# Patient Record
Sex: Female | Born: 1980 | Race: Black or African American | Hispanic: No | Marital: Single | Smoking: Former smoker
Health system: Southern US, Community
[De-identification: ages and names within clinical notes are randomized; demographics above are authoritative.]

## PROBLEM LIST (undated history)

## (undated) DIAGNOSIS — F329 Major depressive disorder, single episode, unspecified: Secondary | ICD-10-CM

## (undated) DIAGNOSIS — F32A Depression, unspecified: Secondary | ICD-10-CM

## (undated) DIAGNOSIS — J45909 Unspecified asthma, uncomplicated: Secondary | ICD-10-CM

## (undated) DIAGNOSIS — F1910 Other psychoactive substance abuse, uncomplicated: Secondary | ICD-10-CM

## (undated) DIAGNOSIS — F319 Bipolar disorder, unspecified: Secondary | ICD-10-CM

## (undated) DIAGNOSIS — F1010 Alcohol abuse, uncomplicated: Secondary | ICD-10-CM

## (undated) DIAGNOSIS — R569 Unspecified convulsions: Secondary | ICD-10-CM

---
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
## 2004-08-14 ENCOUNTER — Observation Stay: Payer: Self-pay | Admitting: Obstetrics and Gynecology

## 2004-09-22 ENCOUNTER — Observation Stay: Payer: Self-pay

## 2004-10-28 ENCOUNTER — Observation Stay: Payer: Self-pay

## 2004-11-21 ENCOUNTER — Observation Stay: Payer: Self-pay

## 2004-11-24 ENCOUNTER — Inpatient Hospital Stay: Payer: Self-pay | Admitting: Obstetrics and Gynecology

## 2006-02-17 ENCOUNTER — Emergency Department: Payer: Self-pay | Admitting: Emergency Medicine

## 2006-10-31 ENCOUNTER — Emergency Department: Payer: Self-pay | Admitting: Emergency Medicine

## 2008-07-06 ENCOUNTER — Emergency Department: Payer: Self-pay | Admitting: Emergency Medicine

## 2008-11-04 ENCOUNTER — Emergency Department: Payer: Self-pay | Admitting: Internal Medicine

## 2009-04-04 ENCOUNTER — Emergency Department: Payer: Self-pay | Admitting: Emergency Medicine

## 2009-05-30 ENCOUNTER — Emergency Department: Payer: Self-pay | Admitting: Emergency Medicine

## 2009-06-27 ENCOUNTER — Ambulatory Visit: Payer: Self-pay | Admitting: Surgery

## 2009-06-28 ENCOUNTER — Ambulatory Visit: Payer: Self-pay | Admitting: Surgery

## 2009-08-16 ENCOUNTER — Emergency Department: Payer: Self-pay | Admitting: Emergency Medicine

## 2009-10-28 ENCOUNTER — Emergency Department: Payer: Self-pay | Admitting: Emergency Medicine

## 2010-06-20 ENCOUNTER — Emergency Department: Payer: Self-pay | Admitting: Emergency Medicine

## 2010-10-21 ENCOUNTER — Emergency Department: Payer: Self-pay | Admitting: Unknown Physician Specialty

## 2010-10-31 ENCOUNTER — Emergency Department: Payer: Self-pay | Admitting: Emergency Medicine

## 2012-02-22 ENCOUNTER — Emergency Department: Payer: Self-pay | Admitting: Unknown Physician Specialty

## 2012-02-22 LAB — URINALYSIS, COMPLETE
Bacteria: NONE SEEN
Bilirubin,UR: NEGATIVE
Blood: NEGATIVE
Glucose,UR: NEGATIVE mg/dL (ref 0–75)
Leukocyte Esterase: NEGATIVE
Nitrite: NEGATIVE
Ph: 6 (ref 4.5–8.0)
Protein: 30
RBC,UR: 1 /HPF (ref 0–5)
Specific Gravity: 1.03 (ref 1.003–1.030)
Squamous Epithelial: 2
WBC UR: 2 /HPF (ref 0–5)

## 2012-02-22 LAB — CBC
HCT: 35 % (ref 35.0–47.0)
HGB: 11.2 g/dL — ABNORMAL LOW (ref 12.0–16.0)
MCH: 24.9 pg — ABNORMAL LOW (ref 26.0–34.0)
MCHC: 32.1 g/dL (ref 32.0–36.0)
MCV: 77 fL — ABNORMAL LOW (ref 80–100)
Platelet: 207 10*3/uL (ref 150–440)
RBC: 4.52 10*6/uL (ref 3.80–5.20)
RDW: 14.3 % (ref 11.5–14.5)
WBC: 6.4 10*3/uL (ref 3.6–11.0)

## 2012-02-22 LAB — COMPREHENSIVE METABOLIC PANEL
Albumin: 4.2 g/dL (ref 3.4–5.0)
Alkaline Phosphatase: 56 U/L (ref 50–136)
Anion Gap: 9 (ref 7–16)
BUN: 15 mg/dL (ref 7–18)
Bilirubin,Total: 0.4 mg/dL (ref 0.2–1.0)
Calcium, Total: 8.9 mg/dL (ref 8.5–10.1)
Chloride: 105 mmol/L (ref 98–107)
Co2: 25 mmol/L (ref 21–32)
Creatinine: 0.76 mg/dL (ref 0.60–1.30)
EGFR (African American): >60
EGFR (Non-African Amer.): >60
Glucose: 88 mg/dL (ref 65–99)
Osmolality: 278 (ref 275–301)
Potassium: 3.6 mmol/L (ref 3.5–5.1)
SGOT(AST): 20 U/L (ref 15–37)
SGPT (ALT): 16 U/L
Sodium: 139 mmol/L (ref 136–145)
Total Protein: 7.6 g/dL (ref 6.4–8.2)

## 2012-02-22 LAB — PREGNANCY, URINE: Pregnancy Test, Urine: NEGATIVE m[IU]/mL

## 2013-03-02 ENCOUNTER — Emergency Department: Payer: Self-pay | Admitting: Emergency Medicine

## 2013-03-02 LAB — CBC WITH DIFFERENTIAL/PLATELET
Basophil #: 0 10*3/uL (ref 0.0–0.1)
Basophil %: 0.2 %
Eosinophil #: 0.1 10*3/uL (ref 0.0–0.7)
Eosinophil %: 1.4 %
HCT: 37.2 % (ref 35.0–47.0)
HGB: 12.1 g/dL (ref 12.0–16.0)
Lymphocyte #: 1.9 10*3/uL (ref 1.0–3.6)
Lymphocyte %: 21.1 %
MCH: 24.5 pg — ABNORMAL LOW (ref 26.0–34.0)
MCHC: 32.5 g/dL (ref 32.0–36.0)
MCV: 75 fL — ABNORMAL LOW (ref 80–100)
Monocyte #: 0.6 x10 3/mm (ref 0.2–0.9)
Monocyte %: 7.1 %
Neutrophil #: 6.3 10*3/uL (ref 1.4–6.5)
Neutrophil %: 70.2 %
Platelet: 207 10*3/uL (ref 150–440)
RBC: 4.93 10*6/uL (ref 3.80–5.20)
RDW: 15.2 % — ABNORMAL HIGH (ref 11.5–14.5)
WBC: 9 10*3/uL (ref 3.6–11.0)

## 2013-03-02 LAB — URINALYSIS, COMPLETE
Bilirubin,UR: NEGATIVE
Blood: NEGATIVE
Glucose,UR: NEGATIVE mg/dL (ref 0–75)
Ketone: NEGATIVE
Nitrite: NEGATIVE
Ph: 5 (ref 4.5–8.0)
Protein: NEGATIVE
RBC,UR: 7 /HPF (ref 0–5)
Specific Gravity: 1.023 (ref 1.003–1.030)
Squamous Epithelial: 25
WBC UR: 3 /HPF (ref 0–5)

## 2013-03-02 LAB — COMPREHENSIVE METABOLIC PANEL
Albumin: 3.9 g/dL (ref 3.4–5.0)
Alkaline Phosphatase: 47 U/L — ABNORMAL LOW (ref 50–136)
Anion Gap: 5 — ABNORMAL LOW (ref 7–16)
BUN: 13 mg/dL (ref 7–18)
Bilirubin,Total: 0.5 mg/dL (ref 0.2–1.0)
Calcium, Total: 8.8 mg/dL (ref 8.5–10.1)
Chloride: 107 mmol/L (ref 98–107)
Co2: 24 mmol/L (ref 21–32)
Creatinine: 0.92 mg/dL (ref 0.60–1.30)
EGFR (African American): >60
EGFR (Non-African Amer.): >60
Glucose: 90 mg/dL (ref 65–99)
Osmolality: 272 (ref 275–301)
Potassium: 4.1 mmol/L (ref 3.5–5.1)
SGOT(AST): 18 U/L (ref 15–37)
SGPT (ALT): 12 U/L (ref 12–78)
Sodium: 136 mmol/L (ref 136–145)
Total Protein: 7.3 g/dL (ref 6.4–8.2)

## 2013-03-02 LAB — GC/CHLAMYDIA PROBE AMP

## 2013-03-02 LAB — PREGNANCY, URINE: Pregnancy Test, Urine: NEGATIVE m[IU]/mL

## 2013-03-02 LAB — WET PREP, GENITAL

## 2013-03-04 LAB — URINE CULTURE

## 2013-10-16 ENCOUNTER — Emergency Department: Payer: Self-pay | Admitting: Emergency Medicine

## 2013-10-16 LAB — URINALYSIS, COMPLETE
Bilirubin,UR: NEGATIVE
Blood: NEGATIVE
Glucose,UR: NEGATIVE mg/dL (ref 0–75)
Nitrite: NEGATIVE
Ph: 5 (ref 4.5–8.0)
Protein: 30
RBC,UR: 1 /HPF (ref 0–5)
Specific Gravity: 1.027 (ref 1.003–1.030)
Squamous Epithelial: 2
WBC UR: 2 /HPF (ref 0–5)

## 2013-10-16 LAB — COMPREHENSIVE METABOLIC PANEL
Albumin: 3.9 g/dL (ref 3.4–5.0)
Alkaline Phosphatase: 45 U/L
Anion Gap: 8 (ref 7–16)
BUN: 10 mg/dL (ref 7–18)
Bilirubin,Total: 0.2 mg/dL (ref 0.2–1.0)
Calcium, Total: 9.1 mg/dL (ref 8.5–10.1)
Chloride: 104 mmol/L (ref 98–107)
Co2: 23 mmol/L (ref 21–32)
Creatinine: 0.62 mg/dL (ref 0.60–1.30)
EGFR (African American): >60
EGFR (Non-African Amer.): >60
Glucose: 80 mg/dL (ref 65–99)
Osmolality: 268 (ref 275–301)
Potassium: 3.5 mmol/L (ref 3.5–5.1)
SGOT(AST): 12 U/L — ABNORMAL LOW (ref 15–37)
SGPT (ALT): 16 U/L (ref 12–78)
Sodium: 135 mmol/L — ABNORMAL LOW (ref 136–145)
Total Protein: 7.8 g/dL (ref 6.4–8.2)

## 2013-10-16 LAB — CBC
HCT: 35 % (ref 35.0–47.0)
HGB: 11.3 g/dL — ABNORMAL LOW (ref 12.0–16.0)
MCH: 24.3 pg — ABNORMAL LOW (ref 26.0–34.0)
MCHC: 32.2 g/dL (ref 32.0–36.0)
MCV: 76 fL — ABNORMAL LOW (ref 80–100)
Platelet: 226 10*3/uL (ref 150–440)
RBC: 4.64 10*6/uL (ref 3.80–5.20)
RDW: 14.2 % (ref 11.5–14.5)
WBC: 10.5 10*3/uL (ref 3.6–11.0)

## 2013-10-16 LAB — PREGNANCY, URINE: Pregnancy Test, Urine: POSITIVE m[IU]/mL

## 2013-10-24 DIAGNOSIS — O039 Complete or unspecified spontaneous abortion without complication: Secondary | ICD-10-CM | POA: Insufficient documentation

## 2013-10-24 DIAGNOSIS — IMO0001 Reserved for inherently not codable concepts without codable children: Secondary | ICD-10-CM | POA: Insufficient documentation

## 2014-11-21 DIAGNOSIS — R51 Headache: Secondary | ICD-10-CM

## 2014-11-21 DIAGNOSIS — G479 Sleep disorder, unspecified: Secondary | ICD-10-CM | POA: Insufficient documentation

## 2014-11-21 DIAGNOSIS — R569 Unspecified convulsions: Secondary | ICD-10-CM | POA: Insufficient documentation

## 2014-11-21 DIAGNOSIS — R519 Headache, unspecified: Secondary | ICD-10-CM | POA: Insufficient documentation

## 2014-11-21 DIAGNOSIS — F951 Chronic motor or vocal tic disorder: Secondary | ICD-10-CM | POA: Insufficient documentation

## 2015-08-03 ENCOUNTER — Emergency Department
Admission: EM | Admit: 2015-08-03 | Discharge: 2015-08-03 | Disposition: A | Discharge status: Home / Self Care | Payer: Medicaid Other | Attending: Emergency Medicine | Admitting: Emergency Medicine

## 2015-08-03 ENCOUNTER — Encounter: Payer: Self-pay | Admitting: Emergency Medicine

## 2015-08-03 DIAGNOSIS — J069 Acute upper respiratory infection, unspecified: Secondary | ICD-10-CM

## 2015-08-03 DIAGNOSIS — Z87891 Personal history of nicotine dependence: Secondary | ICD-10-CM | POA: Diagnosis not present

## 2015-08-03 DIAGNOSIS — R05 Cough: Secondary | ICD-10-CM | POA: Diagnosis present

## 2015-08-03 HISTORY — DX: Unspecified convulsions: R56.9

## 2015-08-03 MED ORDER — AMOXICILLIN 500 MG PO TABS: 500.0000 mg | ORAL_TABLET | Freq: Two times a day (BID) | ORAL | Status: DC

## 2015-08-03 MED ORDER — GUAIFENESIN-CODEINE 100-10 MG/5ML PO SOLN: 10.0000 mL | ORAL | Status: DC | PRN

## 2015-08-03 NOTE — ED Provider Notes (Signed)
[facility] Emergency Department Provider Note  ____________________________________________  Time seen: Approximately 7:26 AM  I have reviewed the triage vital signs and the nursing notes.   HISTORY  Chief Complaint Cough    HPI BROOKS COCKRILL is a 34 y.o. female who presents for evaluation of cough congestion for the last 3-4 days. Patient states that she was seen in urgent care and given medicine for allergies but has progressively continued to get worse with her sore throat and cough. States fever on and off.   Past Medical History  Diagnosis Date  . Seizures ([place])     There are no active problems to display for this patient.   History reviewed. No pertinent past surgical history.  Current Outpatient Rx  Name  Route  Sig  Dispense  Refill  . amoxicillin (AMOXIL) 500 MG tablet   Oral   Take 1 tablet (500 mg total) by mouth 2 (two) times daily.   20 tablet   0   . guaiFENesin-codeine 100-10 MG/5ML syrup   Oral   Take 10 mLs by mouth every 4 (four) hours as needed for cough.   180 mL   0     Allergies Review of patient's allergies indicates no known allergies.  No family history on file.  Social History Social History  Substance Use Topics  . Smoking status: Former Research scientist (life sciences)  . Smokeless tobacco: None  . Alcohol Use: No    Review of Systems Constitutional: Occasional fever/chills Eyes: No visual changes. ENT: Positive sore throat positive runny nose congestion and drainage. Cardiovascular: Denies chest pain. Respiratory: Denies shortness of breath. Positive for cough. Gastrointestinal: No abdominal pain.  No nausea, no vomiting.  No diarrhea.  No constipation. Genitourinary: Negative for dysuria. Musculoskeletal: Negative for back pain. Skin: Negative for rash. Neurological: Negative for headaches, focal weakness or numbness.  10-point ROS otherwise negative.  ____________________________________________  BP 122/76 mmHg   Pulse 87  Temp(Src) 98.6 F (37 C) (Oral)  Resp 20  Ht 5' (1.524 m)  Wt 135 lb (61.236 kg)  BMI 26.37 kg/m2  SpO2 96%  PHYSICAL EXAM:  VITAL SIGNS: ED Triage Vitals  Enc Vitals Group     BP --      Pulse --      Resp --      Temp --      Temp src --      SpO2 --      Weight --      Height --      Head Cir --      Peak Flow --      Pain Score --      Pain Loc --      Pain Edu? --      Excl. in [place]? --     Constitutional: Alert and oriented. Well appearing and in no acute distress. Eyes: Conjunctivae are normal. PERRL. EOMI. Head: Atraumatic. Nose: No congestion/rhinnorhea. Mouth/Throat: Mucous membranes are moist.  Oropharynx non-erythematous. Neck: No stridor.   Cardiovascular: Normal rate, regular rhythm. Grossly normal heart sounds.  Good peripheral circulation. Respiratory: Normal respiratory effort.  No retractions. Lungs CTAB. Gastrointestinal: Soft and nontender. No distention. No abdominal bruits. No CVA tenderness. Musculoskeletal: No lower extremity tenderness nor edema.  No joint effusions. Neurologic:  Normal speech and language. No gross focal neurologic deficits are appreciated. No gait instability. Skin:  Skin is warm, dry and intact. No rash noted. Psychiatric: Mood and affect are normal. Speech and behavior are  normal.  ____________________________________________   LABS (all labs ordered are listed, but only abnormal results are displayed)  Labs Reviewed - No data to display ____________________________________________    PROCEDURES  Procedure(s) performed: None  Critical Care performed: No  ____________________________________________   INITIAL IMPRESSION / ASSESSMENT AND PLAN / ED COURSE  Pertinent labs & imaging results that were available during my care of the patient were reviewed by me and considered in my medical decision making (see chart for details).  Respiratory infection Rx given for Z-Pak, Robitussin-AC. Patient to  follow up with PCP or return to the ER as needed. Patient voices no other emergency medical complaints at this time. ____________________________________________   FINAL CLINICAL IMPRESSION(S) / ED DIAGNOSES  Final diagnoses:  URI, acute      Arlyss Repress, PA-C 08/03/15 0755  Orbie Pyo, MD 08/03/15 [phone]

## 2015-08-03 NOTE — Discharge Instructions (Signed)
Upper Respiratory Infection, Adult Most upper respiratory infections (URIs) are a viral infection of the air passages leading to the lungs. A URI affects the nose, throat, and upper air passages. The most common type of URI is nasopharyngitis and is typically referred to as "the common cold." URIs run their course and usually go away on their own. Most of the time, a URI does not require medical attention, but sometimes a bacterial infection in the upper airways can follow a viral infection. This is called a secondary infection. Sinus and middle ear infections are common types of secondary upper respiratory infections. Bacterial pneumonia can also complicate a URI. A URI can worsen asthma and chronic obstructive pulmonary disease (COPD). Sometimes, these complications can require emergency medical care and may be life threatening.  CAUSES Almost all URIs are caused by viruses. A virus is a type of germ and can spread from one person to another.  RISKS FACTORS You may be at risk for a URI if:   You smoke.   You have chronic heart or lung disease.  You have a weakened defense (immune) system.   You are very young or very old.   You have nasal allergies or asthma.  You work in crowded or poorly ventilated areas.  You work in health care facilities or schools. SIGNS AND SYMPTOMS  Symptoms typically develop 2-3 days after you come in contact with a cold virus. Most viral URIs last 7-10 days. However, viral URIs from the influenza virus (flu virus) can last 14-18 days and are typically more severe. Symptoms may include:   Runny or stuffy (congested) nose.   Sneezing.   Cough.   Sore throat.   Headache.   Fatigue.   Fever.   Loss of appetite.   Pain in your forehead, behind your eyes, and over your cheekbones (sinus pain).  Muscle aches.  DIAGNOSIS  Your health care provider may diagnose a URI by:  Physical exam.  Tests to check that your symptoms are not due to  another condition such as:  Strep throat.  Sinusitis.  Pneumonia.  Asthma. TREATMENT  A URI goes away on its own with time. It cannot be cured with medicines, but medicines may be prescribed or recommended to relieve symptoms. Medicines may help:  Reduce your fever.  Reduce your cough.  Relieve nasal congestion. HOME CARE INSTRUCTIONS   Take medicines only as directed by your health care provider.   Gargle warm saltwater or take cough drops to comfort your throat as directed by your health care provider.  Use a warm mist humidifier or inhale steam from a shower to increase air moisture. This may make it easier to breathe.  Drink enough fluid to keep your urine clear or pale yellow.   Eat soups and other clear broths and maintain good nutrition.   Rest as needed.   Return to work when your temperature has returned to normal or as your health care provider advises. You may need to stay home longer to avoid infecting others. You can also use a face mask and careful hand washing to prevent spread of the virus.  Increase the usage of your inhaler if you have asthma.   Do not use any tobacco products, including cigarettes, chewing tobacco, or electronic cigarettes. If you need help quitting, ask your health care provider. PREVENTION  The best way to protect yourself from getting a cold is to practice good hygiene.   Avoid oral or hand contact with people with cold  symptoms.   °· Wash your hands often if contact occurs.   °There is no clear evidence that vitamin C, vitamin E, echinacea, or exercise reduces the chance of developing a cold. However, it is always recommended to get plenty of rest, exercise, and practice good nutrition.  °SEEK MEDICAL CARE IF:  °· You are getting worse rather than better.   °· Your symptoms are not controlled by medicine.   °· You have chills. °· You have worsening shortness of breath. °· You have brown or red mucus. °· You have yellow or brown nasal  discharge. °· You have pain in your face, especially when you bend forward. °· You have a fever. °· You have swollen neck glands. °· You have pain while swallowing. °· You have white areas in the back of your throat. °SEEK IMMEDIATE MEDICAL CARE IF:  °· You have severe or persistent: °¨ Headache. °¨ Ear pain. °¨ Sinus pain. °¨ Chest pain. °· You have chronic lung disease and any of the following: °¨ Wheezing. °¨ Prolonged cough. °¨ Coughing up blood. °¨ A change in your usual mucus. °· You have a stiff neck. °· You have changes in your: °¨ Vision. °¨ Hearing. °¨ Thinking. °¨ Mood. °MAKE SURE YOU:  °· Understand these instructions. °· Will watch your condition. °· Will get help right away if you are not doing well or get worse. °  °This information is not intended to replace advice given to you by your health care provider. Make sure you discuss any questions you have with your health care provider. °  °Document Released: 02/24/2001 Document Revised: 01/15/2015 Document Reviewed: 12/06/2013 °Elsevier Interactive Patient Education ©2016 [employer] ° °Cough, Adult °Coughing is a reflex that clears your throat and your airways. Coughing helps to heal and protect your lungs. It is normal to cough occasionally, but a cough that happens with other symptoms or lasts a long time may be a sign of a condition that needs treatment. A cough may last only 2-3 weeks (acute), or it may last longer than 8 weeks (chronic). °CAUSES °Coughing is commonly caused by: °· Breathing in substances that irritate your lungs. °· A viral or bacterial respiratory infection. °· Allergies. °· Asthma. °· Postnasal drip. °· Smoking. °· Acid backing up from the stomach into the esophagus (gastroesophageal reflux). °· Certain medicines. °· Chronic lung problems, including COPD (or rarely, lung cancer). °· Other medical conditions such as heart failure. °HOME CARE INSTRUCTIONS  °Pay attention to any changes in your symptoms. Take these actions to  help with your discomfort: °· Take medicines only as told by your health care provider. °¨ If you were prescribed an antibiotic medicine, take it as told by your health care provider. Do not stop taking the antibiotic even if you start to feel better. °¨ Talk with your health care provider before you take a cough suppressant medicine. °· Drink enough fluid to keep your urine clear or pale yellow. °· If the air is dry, use a cold steam vaporizer or humidifier in your bedroom or your home to help loosen secretions. °· Avoid anything that causes you to cough at work or at home. °· If your cough is worse at night, try sleeping in a semi-upright position. °· Avoid cigarette smoke. If you smoke, quit smoking. If you need help quitting, ask your health care provider. °· Avoid caffeine. °· Avoid alcohol. °· Rest as needed. °SEEK MEDICAL CARE IF:  °· You have new symptoms. °· You cough up pus. °· Your cough   does not get better after 2-3 weeks, or your cough gets worse. °· You cannot control your cough with suppressant medicines and you are losing sleep. °· You develop pain that is getting worse or pain that is not controlled with pain medicines. °· You have a fever. °· You have unexplained weight loss. °· You have night sweats. °SEEK IMMEDIATE MEDICAL CARE IF: °· You cough up blood. °· You have difficulty breathing. °· Your heartbeat is very fast. °  °This information is not intended to replace advice given to you by your health care provider. Make sure you discuss any questions you have with your health care provider. °  °Document Released: 02/27/2011 Document Revised: 05/22/2015 Document Reviewed: 11/07/2014 °Elsevier Interactive Patient Education ©2016 Else[employer] Inc. ° °

## 2015-08-03 NOTE — ED Notes (Signed)
Cough x 1 week

## 2015-10-15 ENCOUNTER — Emergency Department: Payer: Worker's Compensation

## 2015-10-15 ENCOUNTER — Encounter: Payer: Self-pay | Admitting: Emergency Medicine

## 2015-10-15 ENCOUNTER — Emergency Department
Admission: EM | Admit: 2015-10-15 | Discharge: 2015-10-15 | Disposition: A | Discharge status: Home / Self Care | Payer: Worker's Compensation | Attending: Emergency Medicine | Admitting: Emergency Medicine

## 2015-10-15 DIAGNOSIS — F17210 Nicotine dependence, cigarettes, uncomplicated: Secondary | ICD-10-CM | POA: Diagnosis not present

## 2015-10-15 DIAGNOSIS — M25512 Pain in left shoulder: Secondary | ICD-10-CM | POA: Diagnosis not present

## 2015-10-15 DIAGNOSIS — W868XXA Exposure to other electric current, initial encounter: Secondary | ICD-10-CM | POA: Diagnosis not present

## 2015-10-15 DIAGNOSIS — Y990 Civilian activity done for income or pay: Secondary | ICD-10-CM | POA: Diagnosis not present

## 2015-10-15 DIAGNOSIS — R0602 Shortness of breath: Secondary | ICD-10-CM | POA: Insufficient documentation

## 2015-10-15 DIAGNOSIS — T754XXA Electrocution, initial encounter: Secondary | ICD-10-CM | POA: Insufficient documentation

## 2015-10-15 DIAGNOSIS — R079 Chest pain, unspecified: Secondary | ICD-10-CM | POA: Diagnosis present

## 2015-10-15 DIAGNOSIS — Y9389 Activity, other specified: Secondary | ICD-10-CM | POA: Insufficient documentation

## 2015-10-15 DIAGNOSIS — R51 Headache: Secondary | ICD-10-CM | POA: Insufficient documentation

## 2015-10-15 DIAGNOSIS — Y9289 Other specified places as the place of occurrence of the external cause: Secondary | ICD-10-CM | POA: Insufficient documentation

## 2015-10-15 LAB — BASIC METABOLIC PANEL
Anion gap: 5 (ref 5–15)
BUN: 12 mg/dL (ref 6–20)
CO2: 23 mmol/L (ref 22–32)
Calcium: 9.3 mg/dL (ref 8.9–10.3)
Chloride: 108 mmol/L (ref 101–111)
Creatinine, Ser: 0.86 mg/dL (ref 0.44–1.00)
GFR calc Af Amer: >60 mL/min (ref >60)
GFR calc non Af Amer: >60 mL/min (ref >60)
Glucose, Bld: 123 mg/dL — ABNORMAL HIGH (ref 65–99)
Potassium: 3.6 mmol/L (ref 3.5–5.1)
Sodium: 136 mmol/L (ref 135–145)

## 2015-10-15 LAB — CBC
HCT: 35.4 % (ref 35.0–47.0)
Hemoglobin: 11.7 g/dL — ABNORMAL LOW (ref 12.0–16.0)
MCH: 24.6 pg — ABNORMAL LOW (ref 26.0–34.0)
MCHC: 33 g/dL (ref 32.0–36.0)
MCV: 74.6 fL — ABNORMAL LOW (ref 80.0–100.0)
Platelets: 211 10*3/uL (ref 150–440)
RBC: 4.75 MIL/uL (ref 3.80–5.20)
RDW: 14.6 % — ABNORMAL HIGH (ref 11.5–14.5)
WBC: 8.5 10*3/uL (ref 3.6–11.0)

## 2015-10-15 LAB — TROPONIN I
Troponin I: <0.03 ng/mL (ref <0.031)
Troponin I: <0.03 ng/mL (ref <0.031)

## 2015-10-15 LAB — CK: Total CK: 126 U/L (ref 38–234)

## 2015-10-15 MED ORDER — KETOROLAC TROMETHAMINE 30 MG/ML IJ SOLN
INTRAMUSCULAR | Status: AC
  Administered 2015-10-15: 30 mg
  Filled 2015-10-15: qty 1

## 2015-10-15 MED ORDER — DIAZEPAM 5 MG/ML IJ SOLN
5.0000 mg | Freq: Once | INTRAMUSCULAR | Status: AC
  Administered 2015-10-15: 5 mg via INTRAVENOUS
  Filled 2015-10-15: qty 2

## 2015-10-15 NOTE — ED Notes (Signed)
Patient presents to the ED after getting "shocked" by a machine at work.  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  Patient states around 2pm today she was shocked in the head.  Patient states she felt kind of dazed, she never fell or lost consciousness.  Patient now is having some chest pain, left shoulder pain and shortness of breath.  Patient states chest pain feels like a squeezing or tightening.

## 2015-10-15 NOTE — ED Notes (Signed)
Iv started and meds given.  Pt anxious.  Family with pt.  nsr on monitor.  Repeat ekg done.

## 2015-10-15 NOTE — ED Provider Notes (Signed)
[facility] Emergency Department Provider Note  ____________________________________________  Time seen: Approximately [id number] PM  I have reviewed the triage vital signs and the nursing notes.   HISTORY  Chief Complaint Chest Pain and Shortness of Breath    HPI Ashley Fry is a 35 y.o. female with a history of asthma who is presenting today with chest pain after being shocked by machine at work. She says that she works in [employer] and was touching a switch on a machine when she felt a hard shock that made her feel like she was going to pass out. She said that she felt the shock always from right arm to the back of her head. At this time she is having a 10 out of 10 squeezing pain that she describes mostly in her left trapezius. She denies any nausea or vomiting. Says that she does have some associated shortness of breath.   Past Medical History  Diagnosis Date  . Seizures ([place])     There are no active problems to display for this patient.   History reviewed. No pertinent past surgical history.  No current outpatient prescriptions on file.  Allergies Review of patient's allergies indicates no known allergies.  No family history on file.  Social History Social History  Substance Use Topics  . Smoking status: Current Every Day Smoker -- 0.10 packs/day    Types: Cigarettes  . Smokeless tobacco: None  . Alcohol Use: No    Review of Systems Constitutional: No fever/chills Eyes: No visual changes. ENT: No sore throat. Cardiovascular: As above Respiratory: As above Gastrointestinal: No abdominal pain.  No nausea, no vomiting.  No diarrhea.  No constipation. Genitourinary: Negative for dysuria. Musculoskeletal: Negative for back pain. Skin: Negative for rash. Neurological: Negative for headaches, focal weakness or numbness.  10-point ROS otherwise negative.  ____________________________________________   PHYSICAL EXAM:  VITAL  SIGNS: ED Triage Vitals  Enc Vitals Group     BP 10/15/15 1853 157/87 mmHg     Pulse Rate 10/15/15 1853 98     Resp 10/15/15 1853 20     Temp 10/15/15 1853 98.4 F (36.9 C)     Temp Source 10/15/15 1853 Oral     SpO2 10/15/15 1853 100 %     Weight 10/15/15 1853 138 lb (62.596 kg)     Height 10/15/15 1853 5' (1.524 m)     Head Cir --      Peak Flow --      Pain Score 10/15/15 1854 10     Pain Loc --      Pain Edu? --      Excl. in [place]? --     Constitutional: Alert and oriented. Well appearing and in no acute distress. Eyes: Conjunctivae are normal. PERRL. EOMI. Head: Atraumatic. Nose: No congestion/rhinnorhea. Mouth/Throat: Mucous membranes are moist.   Neck: No stridor.   Cardiovascular: Normal rate, regular rhythm. Grossly normal heart sounds.  Good peripheral circulation. Reproducible tenderness to the left trapezius as well as over the sternum. Respiratory: Normal respiratory effort.  No retractions. Lungs CTAB. Gastrointestinal: Soft and nontender. No distention. No abdominal bruits. No CVA tenderness. Musculoskeletal: No lower extremity tenderness nor edema.  No joint effusions. Neurologic:  Normal speech and language. No gross focal neurologic deficits are appreciated. Skin:  Skin is warm, dry and intact. No rash noted. Psychiatric: Mood and affect are normal. Speech and behavior are normal.  ____________________________________________   LABS (all labs ordered are listed, but only  abnormal results are displayed)  Labs Reviewed  BASIC METABOLIC PANEL - Abnormal; Notable for the following:    Glucose, Bld 123 (*)    All other components within normal limits  CBC - Abnormal; Notable for the following:    Hemoglobin 11.7 (*)    MCV 74.6 (*)    MCH 24.6 (*)    RDW 14.6 (*)    All other components within normal limits  TROPONIN I  TROPONIN I  CK   ____________________________________________  EKG  ED ECG REPORT I, Doran Stabler, the attending physician,  personally viewed and interpreted this ECG.   Date: 10/15/2015  EKG Time: 1855  Rate: 97  Rhythm: normal sinus rhythm  Axis: Normal axis  Intervals:none  ST&T Change: T wave inversions in V2 and V3 which are unchanged from previous. Possible lateral T-wave inversions but with poor baseline. We'll repeat EKG. No ST elevations or depressions.  ED ECG REPORT I, Doran Stabler, the attending physician, personally viewed and interpreted this ECG.   Date: 10/15/2015  EKG Time: 2220  Rate: 70  Rhythm: normal sinus rhythm  Axis: Normal axis  Intervals:none  ST&T Change: No ST segment elevation or depression. T-wave inversions in V1 through V3 which are unchanged from previous. Normal T waves in the lateral leads of V4 through V6.   ____________________________________________  RADIOLOGY  No acute finding on the chest x-ray. ____________________________________________   PROCEDURES   ____________________________________________   INITIAL IMPRESSION / ASSESSMENT AND PLAN / ED COURSE  Pertinent labs & imaging results that were available during my care of the patient were reviewed by me and considered in my medical decision making (see chart for details).  ----------------------------------------- 11:40 PM on 10/15/2015 -----------------------------------------  Patient says that the pain is now 7 out of 10 after Valium. Second EKG as well as labs are highly reassuring. There are no changes on the second EKG when compared to previous EKGs from past visits. The second troponin is undetectable as well as a normal CK. Patient still with tenderness to the anterior chest as well as the left trapezius. Likely muscle soreness. She says that she does have soreness sometimes from her job which does require some degree of forceful lifting.  No evidence of cardiac damage and the patient has been here for over 9 hours from the time of the initial injury without syncope or arrhythmia. Do not  suspect any pathologic cardiac injury at this time. We'll discharge to home. Explained the plan as well as the results to the patient as well as her father. They understand and are willing to comply. The patient says that she will use ibuprofen at home as well as muscle cream such as icy hot or Aspercreme for pain relief. She is a patient at the [facility] and will follow-up there for continuing care. ____________________________________________   FINAL CLINICAL IMPRESSION(S) / ED DIAGNOSES  Electrical shock. Chest pain.    Orbie Pyo, MD 10/15/15 [phone]

## 2015-10-15 NOTE — ED Notes (Signed)
Pt reports getting shocked at work today and having pain in the back head.  Happened at 1400 today.  Now pt has intermittent chest pain.  States pain goes across chest, neck and shoulders.  No sob.  cig smoker.  nsr on monitor.  Family with pt.

## 2015-12-22 ENCOUNTER — Encounter: Payer: Self-pay | Admitting: *Deleted

## 2015-12-22 ENCOUNTER — Emergency Department: Payer: Medicaid Other

## 2015-12-22 ENCOUNTER — Emergency Department
Admission: EM | Admit: 2015-12-22 | Discharge: 2015-12-22 | Disposition: A | Discharge status: Home / Self Care | Payer: Medicaid Other | Attending: Emergency Medicine | Admitting: Emergency Medicine

## 2015-12-22 DIAGNOSIS — F17210 Nicotine dependence, cigarettes, uncomplicated: Secondary | ICD-10-CM | POA: Diagnosis not present

## 2015-12-22 DIAGNOSIS — J029 Acute pharyngitis, unspecified: Secondary | ICD-10-CM | POA: Diagnosis not present

## 2015-12-22 DIAGNOSIS — R05 Cough: Secondary | ICD-10-CM | POA: Diagnosis present

## 2015-12-22 DIAGNOSIS — R112 Nausea with vomiting, unspecified: Secondary | ICD-10-CM

## 2015-12-22 LAB — POCT RAPID STREP A: Streptococcus, Group A Screen (Direct): NEGATIVE

## 2015-12-22 MED ORDER — PROMETHAZINE-DM 6.25-15 MG/5ML PO SYRP: 5.0000 mL | ORAL_SOLUTION | Freq: Four times a day (QID) | ORAL | Status: DC | PRN

## 2015-12-22 MED ORDER — MAGIC MOUTHWASH W/LIDOCAINE: 5.0000 mL | Freq: Four times a day (QID) | ORAL | Status: DC | PRN

## 2015-12-22 MED ORDER — AMOXICILLIN 875 MG PO TABS: 875.0000 mg | ORAL_TABLET | Freq: Two times a day (BID) | ORAL | Status: DC

## 2015-12-22 MED ORDER — ONDANSETRON 4 MG PO TBDP
4.0000 mg | ORAL_TABLET | Freq: Once | ORAL | Status: AC
  Administered 2015-12-22: 4 mg via ORAL
  Filled 2015-12-22: qty 1

## 2015-12-22 NOTE — Discharge Instructions (Signed)
Nausea and Vomiting Nausea is a sick feeling that often comes before throwing up (vomiting). Vomiting is a reflex where stomach contents come out of your mouth. Vomiting can cause severe loss of body fluids (dehydration). Children and elderly adults can become dehydrated quickly, especially if they also have diarrhea. Nausea and vomiting are symptoms of a condition or disease. It is important to find the cause of your symptoms. CAUSES   Direct irritation of the stomach lining. This irritation can result from increased acid production (gastroesophageal reflux disease), infection, food poisoning, taking certain medicines (such as nonsteroidal anti-inflammatory drugs), alcohol use, or tobacco use.  Signals from the brain.These signals could be caused by a headache, heat exposure, an inner ear disturbance, increased pressure in the brain from injury, infection, a tumor, or a concussion, pain, emotional stimulus, or metabolic problems.  An obstruction in the gastrointestinal tract (bowel obstruction).  Illnesses such as diabetes, hepatitis, gallbladder problems, appendicitis, kidney problems, cancer, sepsis, atypical symptoms of a heart attack, or eating disorders.  Medical treatments such as chemotherapy and radiation.  Receiving medicine that makes you sleep (general anesthetic) during surgery. DIAGNOSIS Your caregiver may ask for tests to be done if the problems do not improve after a few days. Tests may also be done if symptoms are severe or if the reason for the nausea and vomiting is not clear. Tests may include:  Urine tests.  Blood tests.  Stool tests.  Cultures (to look for evidence of infection).  X-rays or other imaging studies. Test results can help your caregiver make decisions about treatment or the need for additional tests. TREATMENT You need to stay well hydrated. Drink frequently but in small amounts.You may wish to drink water, sports drinks, clear broth, or eat frozen  ice pops or gelatin dessert to help stay hydrated.When you eat, eating slowly may help prevent nausea.There are also some antinausea medicines that may help prevent nausea. HOME CARE INSTRUCTIONS   Take all medicine as directed by your caregiver.  If you do not have an appetite, do not force yourself to eat. However, you must continue to drink fluids.  If you have an appetite, eat a normal diet unless your caregiver tells you differently.  Eat a variety of complex carbohydrates (rice, wheat, potatoes, bread), lean meats, yogurt, fruits, and vegetables.  Avoid high-fat foods because they are more difficult to digest.  Drink enough water and fluids to keep your urine clear or pale yellow.  If you are dehydrated, ask your caregiver for specific rehydration instructions. Signs of dehydration may include:  Severe thirst.  Dry lips and mouth.  Dizziness.  Dark urine.  Decreasing urine frequency and amount.  Confusion.  Rapid breathing or pulse. SEEK IMMEDIATE MEDICAL CARE IF:   You have blood or brown flecks (like coffee grounds) in your vomit.  You have black or bloody stools.  You have a severe headache or stiff neck.  You are confused.  You have severe abdominal pain.  You have chest pain or trouble breathing.  You do not urinate at least once every 8 hours.  You develop cold or clammy skin.  You continue to vomit for longer than 24 to 48 hours.  You have a fever. MAKE SURE YOU:   Understand these instructions.  Will watch your condition.  Will get help right away if you are not doing well or get worse.   This information is not intended to replace advice given to you by your health care provider. Make sure  you discuss any questions you have with your health care provider.   Document Released: 08/31/2005 Document Revised: 11/23/2011 Document Reviewed: 01/28/2011 Elsevier Interactive Patient Education 2016 Elsevier Inc[employer]  Pharyngitis Pharyngitis is  redness, pain, and swelling (inflammation) of your pharynx.  CAUSES  Pharyngitis is usually caused by infection. Most of the time, these infections are from viruses (viral) and are part of a cold. However, sometimes pharyngitis is caused by bacteria (bacterial). Pharyngitis can also be caused by allergies. Viral pharyngitis may be spread from person to person by coughing, sneezing, and personal items or utensils (cups, forks, spoons, toothbrushes). Bacterial pharyngitis may be spread from person to person by more intimate contact, such as kissing.  SIGNS AND SYMPTOMS  Symptoms of pharyngitis include:   Sore throat.   Tiredness (fatigue).   Low-grade fever.   Headache.  Joint pain and muscle aches.  Skin rashes.  Swollen lymph nodes.  Plaque-like film on throat or tonsils (often seen with bacterial pharyngitis). DIAGNOSIS  Your health care provider will ask you questions about your illness and your symptoms. Your medical history, along with a physical exam, is often all that is needed to diagnose pharyngitis. Sometimes, a rapid strep test is done. Other lab tests may also be done, depending on the suspected cause.  TREATMENT  Viral pharyngitis will usually get better in 3-4 days without the use of medicine. Bacterial pharyngitis is treated with medicines that kill germs (antibiotics).  HOME CARE INSTRUCTIONS   Drink enough water and fluids to keep your urine clear or pale yellow.   Only take over-the-counter or prescription medicines as directed by your health care provider:   If you are prescribed antibiotics, make sure you finish them even if you start to feel better.   Do not take aspirin.   Get lots of rest.   Gargle with 8 oz of salt water ( tsp of salt per 1 qt of water) as often as every 1-2 hours to soothe your throat.   Throat lozenges (if you are not at risk for choking) or sprays may be used to soothe your throat. SEEK MEDICAL CARE IF:   You have large,  tender lumps in your neck.  You have a rash.  You cough up green, yellow-brown, or bloody spit. SEEK IMMEDIATE MEDICAL CARE IF:   Your neck becomes stiff.  You drool or are unable to swallow liquids.  You vomit or are unable to keep medicines or liquids down.  You have severe pain that does not go away with the use of recommended medicines.  You have trouble breathing (not caused by a stuffy nose). MAKE SURE YOU:   Understand these instructions.  Will watch your condition.  Will get help right away if you are not doing well or get worse.   This information is not intended to replace advice given to you by your health care provider. Make sure you discuss any questions you have with your health care provider.   Document Released: 08/31/2005 Document Revised: 06/21/2013 Document Reviewed: 05/08/2013 Elsevier Interactive Patient Education [employer].

## 2015-12-22 NOTE — ED Provider Notes (Signed)
[facility] Emergency Department Provider Note  ____________________________________________  Time seen: Approximately 8:15 AM  I have reviewed the triage vital signs and the nursing notes.   HISTORY  Chief Complaint Sore Throat    HPI Ashley Fry is a 35 y.o. female , NAD, presents to the emergency department with 2 day history of sore throat, fever, cough. States she has been seen both in urgent care and her primary care provider in the last couple of days. Was diagnosed with pharyngitis by urgent care and given penicillin to take. Patient notes a penicillin seems to be making her nauseous and she can't keep the medication down. She was then seen the next day by her primary care provider who believes she had flu and started her on Tamiflu. Patient notes she hasn't felt much better since starting either the antibiotic nor the Tamiflu. Has had increasing sore throat, no change in fever, nausea and vomiting. Has noted no rashes. States her son had strep confirmed approximately one week ago. Notes she also has a cough that has caused left anterior lower rib pain. The pain only occurs when she coughs. Denies any chest pain, shortness of breath, wheezing, visual changes, numbness, weakness, tingling. Has had no diarrhea or abdominal pain.   Past Medical History  Diagnosis Date  . Seizures ([place])     There are no active problems to display for this patient.   History reviewed. No pertinent past surgical history.  Current Outpatient Rx  Name  Route  Sig  Dispense  Refill  . amoxicillin (AMOXIL) 875 MG tablet   Oral   Take 1 tablet (875 mg total) by mouth 2 (two) times daily.   20 tablet   0   . magic mouthwash w/lidocaine SOLN   Oral   Take 5 mLs by mouth 4 (four) times daily as needed for mouth pain.   240 mL   0     Please mix 30mL diphenhydramine, 73mL nystatin, 80 ...   . promethazine-dextromethorphan (PROMETHAZINE-DM) 6.25-15 MG/5ML syrup    Oral   Take 5 mLs by mouth 4 (four) times daily as needed for cough.   118 mL   0     Allergies Review of patient's allergies indicates no known allergies.  History reviewed. No pertinent family history.  Social History Social History  Substance Use Topics  . Smoking status: Current Every Day Smoker -- 0.10 packs/day    Types: Cigarettes  . Smokeless tobacco: None  . Alcohol Use: No     Review of Systems  Constitutional: Positive fever. No chills, rigors. Eyes: No visual changes. No discharge, redness, swelling ENT: Positive sore throat but no swelling. No nasal congestion, runny nose, headache, sinus pressure, ear pain Cardiovascular: No chest pain or palpitations. Respiratory: Positive cough. No shortness of breath. No wheezing.  Gastrointestinal: As of nausea, vomiting. No abdominal pain.   No diarrhea.   Musculoskeletal: Positive rib pain with cough. Negative for back and neck pain.  Skin: Negative for rash, bruising, skin sores. Neurological: Negative for headaches, focal weakness or numbness. No tingling. 10-point ROS otherwise negative.  ____________________________________________   PHYSICAL EXAM:  VITAL SIGNS: ED Triage Vitals  Enc Vitals Group     BP 12/22/15 0807 122/83 mmHg     Pulse Rate 12/22/15 0807 97     Resp 12/22/15 0807 18     Temp 12/22/15 0807 99.5 F (37.5 C)     Temp Source 12/22/15 0807 Oral  SpO2 12/22/15 0807 99 %     Weight 12/22/15 0807 140 lb (63.504 kg)     Height 12/22/15 0807 5' (1.524 m)     Head Cir --      Peak Flow --      Pain Score 12/22/15 0808 10     Pain Loc --      Pain Edu? --      Excl. in [place]? --     Constitutional: Alert and oriented. Well appearing and in no acute distress. Eyes: Conjunctivae are normal. PERRL. EOMI without pain.  Head: Atraumatic. ENT:      Ears: TMs visualized bilaterally with normal light reflex, no effusion, erythema, bulging, perforation.      Nose: No congestion/rhinnorhea.       Mouth/Throat: Mucous membranes are moist. Moderate erythema about the pharynx without swelling or exudate. Neck: No stridor. Supple with full range of motion Hematological/Lymphatic/Immunilogical: No cervical lymphadenopathy but patient tender about the anterior cervical chain. Cardiovascular: Normal rate, regular rhythm. Normal S1 and S2.  Good peripheral circulation. Respiratory: Normal respiratory effort without tachypnea or retractions. Lungs CTAB with breath sounds noted throughout all lung fields. Neurologic:  Normal speech and language. No gross focal neurologic deficits are appreciated.  Skin:  Skin is warm, dry and intact. No rash noted. Psychiatric: Mood and affect are normal. Speech and behavior are normal. Patient exhibits appropriate insight and judgement.   ____________________________________________   LABS (all labs ordered are listed, but only abnormal results are displayed)  Labs Reviewed  CULTURE, GROUP A STREP [facility])   ____________________________________________  EKG  None ____________________________________________  RADIOLOGY I have personally viewed and evaluated these images (plain radiographs) as part of my medical decision making, as well as reviewing the written report by the radiologist.  Dg Chest 2 View  12/22/2015  CLINICAL DATA:  Sore throat, cough and fever for 3 days. EXAM: CHEST  2 VIEW COMPARISON:  10/15/2015 FINDINGS: The heart size and mediastinal contours are within normal limits. Both lungs are clear. The visualized skeletal structures are unremarkable. IMPRESSION: No active cardiopulmonary disease. Electronically Signed   By: Markus Daft M.D.   On: 12/22/2015 08:55    ____________________________________________    PROCEDURES  Procedure(s) performed: None     Medications  ondansetron (ZOFRAN-ODT) disintegrating tablet 4 mg (4 mg Oral Given 12/22/15 0840)     ____________________________________________   INITIAL IMPRESSION /  ASSESSMENT AND PLAN / ED COURSE  Pertinent labs & imaging results that were available during my care of the patient were reviewed by me and considered in my medical decision making (see chart for details).  Patient's diagnosis is consistent with acute pharyngitis causing nausea, vomiting. Anticipate the patient has strep pharyngitis. Throat culture was sent to evaluate further.  Patient will be discharged home with prescriptions for amoxicillin, Magic mouthwash with lidocaine, promethazine DM syrup to take as directed. Patient may continue with Tylenol or ibuprofen as needed for fever or aches. 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 Patient is to follow up with her primary care provider if symptoms persist past this treatment course. Patient is given ED precautions to return to the ED for any worsening or new symptoms.      ____________________________________________  FINAL CLINICAL IMPRESSION(S) / ED DIAGNOSES  Final diagnoses:  Acute pharyngitis, unspecified etiology  Non-intractable vomiting with nausea, vomiting of unspecified type      NEW MEDICATIONS STARTED DURING THIS VISIT:  New Prescriptions   AMOXICILLIN (AMOXIL)  875 MG TABLET    Take 1 tablet (875 mg total) by mouth 2 (two) times daily.   MAGIC MOUTHWASH W/LIDOCAINE SOLN    Take 5 mLs by mouth 4 (four) times daily as needed for mouth pain.   PROMETHAZINE-DEXTROMETHORPHAN (PROMETHAZINE-DM) 6.25-15 MG/5ML SYRUP    Take 5 mLs by mouth 4 (four) times daily as needed for cough.         Braxton Feathers, PA-C 12/22/15 [id number]  Lavonia Drafts, MD 12/22/15 [phone]

## 2015-12-22 NOTE — ED Notes (Signed)
Patient returned from XR. 

## 2015-12-22 NOTE — ED Notes (Addendum)
Pt states sore throat and a fever since Friday evening, dry cough present

## 2015-12-24 LAB — CULTURE, GROUP A STREP (THRC)

## 2016-07-07 ENCOUNTER — Encounter: Payer: Self-pay | Admitting: Emergency Medicine

## 2016-07-07 ENCOUNTER — Emergency Department
Admission: EM | Admit: 2016-07-07 | Discharge: 2016-07-07 | Disposition: A | Discharge status: Home / Self Care | Payer: No Typology Code available for payment source | Attending: Emergency Medicine | Admitting: Emergency Medicine

## 2016-07-07 DIAGNOSIS — Y92481 Parking lot as the place of occurrence of the external cause: Secondary | ICD-10-CM | POA: Insufficient documentation

## 2016-07-07 DIAGNOSIS — Z87891 Personal history of nicotine dependence: Secondary | ICD-10-CM | POA: Diagnosis not present

## 2016-07-07 DIAGNOSIS — S161XXA Strain of muscle, fascia and tendon at neck level, initial encounter: Secondary | ICD-10-CM | POA: Diagnosis not present

## 2016-07-07 DIAGNOSIS — S39012A Strain of muscle, fascia and tendon of lower back, initial encounter: Secondary | ICD-10-CM | POA: Insufficient documentation

## 2016-07-07 DIAGNOSIS — J45909 Unspecified asthma, uncomplicated: Secondary | ICD-10-CM | POA: Insufficient documentation

## 2016-07-07 DIAGNOSIS — Y9389 Activity, other specified: Secondary | ICD-10-CM | POA: Insufficient documentation

## 2016-07-07 DIAGNOSIS — Y999 Unspecified external cause status: Secondary | ICD-10-CM | POA: Insufficient documentation

## 2016-07-07 DIAGNOSIS — S199XXA Unspecified injury of neck, initial encounter: Secondary | ICD-10-CM | POA: Diagnosis present

## 2016-07-07 HISTORY — DX: Unspecified asthma, uncomplicated: J45.909

## 2016-07-07 MED ORDER — MELOXICAM 15 MG PO TABS: 15.0000 mg | ORAL_TABLET | Freq: Every day | ORAL | 0 refills | Status: DC

## 2016-07-07 MED ORDER — METHOCARBAMOL 500 MG PO TABS: 500.0000 mg | ORAL_TABLET | Freq: Four times a day (QID) | ORAL | 0 refills | Status: DC

## 2016-07-07 MED ORDER — KETOROLAC TROMETHAMINE 30 MG/ML IJ SOLN
30.0000 mg | Freq: Once | INTRAMUSCULAR | Status: AC
  Administered 2016-07-07: 30 mg via INTRAMUSCULAR
  Filled 2016-07-07: qty 1
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
## 2016-09-15 ENCOUNTER — Encounter: Payer: Self-pay | Admitting: *Deleted

## 2016-09-15 ENCOUNTER — Emergency Department
Admission: EM | Admit: 2016-09-15 | Discharge: 2016-09-15 | Disposition: A | Discharge status: Home / Self Care | Payer: Medicaid Other | Attending: Emergency Medicine | Admitting: Emergency Medicine

## 2016-09-15 ENCOUNTER — Emergency Department: Payer: Medicaid Other

## 2016-09-15 DIAGNOSIS — J111 Influenza due to unidentified influenza virus with other respiratory manifestations: Secondary | ICD-10-CM | POA: Diagnosis not present

## 2016-09-15 DIAGNOSIS — Z87891 Personal history of nicotine dependence: Secondary | ICD-10-CM | POA: Diagnosis not present

## 2016-09-15 DIAGNOSIS — R05 Cough: Secondary | ICD-10-CM | POA: Diagnosis present

## 2016-09-15 DIAGNOSIS — Z79899 Other long term (current) drug therapy: Secondary | ICD-10-CM | POA: Insufficient documentation

## 2016-09-15 DIAGNOSIS — J45909 Unspecified asthma, uncomplicated: Secondary | ICD-10-CM | POA: Diagnosis not present

## 2016-09-15 LAB — INFLUENZA PANEL BY PCR (TYPE A & B)
Influenza A By PCR: POSITIVE — AB
Influenza B By PCR: NEGATIVE

## 2016-09-15 MED ORDER — ONDANSETRON 4 MG PO TBDP
4.0000 mg | ORAL_TABLET | Freq: Once | ORAL | Status: AC
  Administered 2016-09-15: 4 mg via ORAL
  Filled 2016-09-15: qty 1

## 2016-09-15 MED ORDER — OSELTAMIVIR PHOSPHATE 75 MG PO CAPS: 75.0000 mg | ORAL_CAPSULE | Freq: Two times a day (BID) | ORAL | 0 refills | Status: AC

## 2016-09-15 MED ORDER — KETOROLAC TROMETHAMINE 60 MG/2ML IM SOLN
60.0000 mg | Freq: Once | INTRAMUSCULAR | Status: AC
  Administered 2016-09-15: 60 mg via INTRAMUSCULAR
  Filled 2016-09-15: qty 2

## 2016-09-15 MED ORDER — IBUPROFEN 600 MG PO TABS: 600.0000 mg | ORAL_TABLET | Freq: Three times a day (TID) | ORAL | 0 refills | Status: DC | PRN

## 2016-09-15 MED ORDER — PSEUDOEPH-BROMPHEN-DM 30-2-10 MG/5ML PO SYRP: 5.0000 mL | ORAL_SOLUTION | Freq: Four times a day (QID) | ORAL | 0 refills | Status: DC | PRN

## 2016-09-15 NOTE — ED Notes (Signed)
See triage note  states she developed cough and wheezing about 4 days ago  Had fever 2 days ago  States cough has been intermittently prod.  But this am chest feels more tight

## 2016-09-15 NOTE — ED Triage Notes (Signed)
Pt to ed with c/o cough, congestion and fever since Saturday.

## 2016-09-15 NOTE — ED Provider Notes (Signed)
[facility] Emergency Department Provider Note   ____________________________________________   First MD Initiated Contact with Patient 09/15/16 0945     (approximate)  I have reviewed the triage vital signs and the nursing notes.   HISTORY  Chief Complaint Fever and Cough    HPI Ashley Fry is a 36 y.o. female patient complaining of cough, nasal chest congestion, and fever since Saturday. Patient also complaining of nausea and body aches. Patient denies vomiting or diarrhea. No palliative measures for this complaint. Patient has not taken his flu shot this season. Patient rates the pain as a 10 over 10. Patient described a pain as "achy".   Past Medical History:  Diagnosis Date  . Asthma   . Seizures ([place])     There are no active problems to display for this patient.   History reviewed. No pertinent surgical history.  Prior to Admission medications   Medication Sig Start Date End Date Taking? Authorizing Provider  albuterol-ipratropium (COMBIVENT) 18-103 MCG/ACT inhaler Inhale 2 puffs into the lungs every 6 (six) hours as needed for wheezing or shortness of breath.   Yes Historical Provider, MD  amoxicillin (AMOXIL) 875 MG tablet Take 1 tablet (875 mg total) by mouth 2 (two) times daily. 12/22/15   Jami L Hagler, PA-C  brompheniramine-pseudoephedrine-DM 30-2-10 MG/5ML syrup Take 5 mLs by mouth 4 (four) times daily as needed. 09/15/16   Sable Feil, PA-C  ibuprofen (ADVIL,MOTRIN) 600 MG tablet Take 1 tablet (600 mg total) by mouth every 8 (eight) hours as needed. 09/15/16   Sable Feil, PA-C  magic mouthwash w/lidocaine SOLN Take 5 mLs by mouth 4 (four) times daily as needed for mouth pain. 12/22/15   Jami L Hagler, PA-C  meloxicam (MOBIC) 15 MG tablet Take 1 tablet (15 mg total) by mouth daily. 07/07/16   Charline Bills Cuthriell, PA-C  methocarbamol (ROBAXIN) 500 MG tablet Take 1 tablet (500 mg total) by mouth 4 (four) times daily. 07/07/16    Charline Bills Cuthriell, PA-C  oseltamivir (TAMIFLU) 75 MG capsule Take 1 capsule (75 mg total) by mouth 2 (two) times daily. 09/15/16 09/20/16  Sable Feil, PA-C  promethazine-dextromethorphan (PROMETHAZINE-DM) 6.25-15 MG/5ML syrup Take 5 mLs by mouth 4 (four) times daily as needed for cough. 12/22/15   Jami L Hagler, PA-C    Allergies Patient has no known allergies.  History reviewed. No pertinent family history.  Social History Social History  Substance Use Topics  . Smoking status: Former Smoker    Packs/day: 0.10    Types: Cigarettes  . Smokeless tobacco: Never Used  . Alcohol use No    Review of Systems Constitutional: Fever and chills. Body aches Eyes: No visual changes. ENT: Nasal congestion runny nose. Sore throat Cardiovascular: Denies chest pain. Respiratory:  shortness of breath. Intermittently productive nonproductive cough Gastrointestinal: No abdominal pain.   nausea, no vomiting.  No diarrhea.  No constipation. Genitourinary: Negative for dysuria. Musculoskeletal: Negative for back pain. Skin: Negative for rash. Neurological: Positive for frontal headaches, denies focal weakness or numbness.    ____________________________________________   PHYSICAL EXAM:  VITAL SIGNS: ED Triage Vitals  Enc Vitals Group     BP 09/15/16 0910 116/83     Pulse Rate 09/15/16 0910 86     Resp 09/15/16 0910 (!) 22     Temp 09/15/16 0910 97.7 F (36.5 C)     Temp src --      SpO2 09/15/16 0910 99 %  Weight 09/15/16 0905 136 lb (61.7 kg)     Height 09/15/16 0905 5' (1.524 m)     Head Circumference --      Peak Flow --      Pain Score 09/15/16 0906 10     Pain Loc --      Pain Edu? --      Excl. in [place]? --     Constitutional: Alert and oriented. Well appearing and in no acute distress. Eyes: Conjunctivae are normal. PERRL. EOMI. Head: Atraumatic. Nose: Edematous nasal terms bilateral maxillary guarding Mouth/Throat: Mucous membranes are moist.  Oropharynx  non-erythematous. Postnasal drainage Neck: No stridor.  No cervical spine tenderness to palpation. Hematological/Lymphatic/Immunilogical: No cervical lymphadenopathy. Cardiovascular: Normal rate, regular rhythm. Grossly normal heart sounds.  Good peripheral circulation. Respiratory: Normal respiratory effort.  No retractions. Lungs with bilateral rhonchi  Sounds Gastrointestinal: Soft and nontender. No distention. No abdominal bruits. No CVA tenderness. Musculoskeletal: No lower extremity tenderness nor edema.  No joint effusions. Neurologic:  Normal speech and language. No gross focal neurologic deficits are appreciated. No gait instability. Skin:  Skin is warm, dry and intact. No rash noted. Psychiatric: Mood and affect are normal. Speech and behavior are normal.  ____________________________________________   LABS (all labs ordered are listed, but only abnormal results are displayed)  Labs Reviewed  INFLUENZA PANEL BY PCR (TYPE A & B, H1N1) - Abnormal; Notable for the following:       Result Value   Influenza A By PCR POSITIVE (*)    All other components within normal limits   ____________________________________________  EKG   ____________________________________________  RADIOLOGY  No acute findings on chest x-ray ____________________________________________   PROCEDURES  Procedure(s) performed: None  Procedures  Critical Care performed: No  ____________________________________________   INITIAL IMPRESSION / ASSESSMENT AND PLAN / ED COURSE  Pertinent labs & imaging results that were available during my care of the patient were reviewed by me and considered in my medical decision making (see chart for details).  Influenza "A". Patient given discharge Instructions. Patient Prescription for Tamiflu, from [facility], Ibuprofen. Patient Given a Work Note for 2 Days. Patient Advised Follow-Up Family Doctor Condition Persists.  Clinical Course   Discuss with patient  positive influenza A and advise she is contagious this time.   ____________________________________________   FINAL CLINICAL IMPRESSION(S) / ED DIAGNOSES  Final diagnoses:  Flu      NEW MEDICATIONS STARTED DURING THIS VISIT:  New Prescriptions   BROMPHENIRAMINE-PSEUDOEPHEDRINE-DM 30-2-10 MG/5ML SYRUP    Take 5 mLs by mouth 4 (four) times daily as needed.   IBUPROFEN (ADVIL,MOTRIN) 600 MG TABLET    Take 1 tablet (600 mg total) by mouth every 8 (eight) hours as needed.   OSELTAMIVIR (TAMIFLU) 75 MG CAPSULE    Take 1 capsule (75 mg total) by mouth 2 (two) times daily.     Note:  This document was prepared using Dragon voice recognition software and may include unintentional dictation errors.    Sable Feil, PA-C 09/15/16 1128    Earleen Newport, MD 09/15/16 [phone]

## 2016-09-17 ENCOUNTER — Encounter: Payer: Self-pay | Admitting: Emergency Medicine

## 2016-09-17 ENCOUNTER — Emergency Department
Admission: EM | Admit: 2016-09-17 | Discharge: 2016-09-17 | Disposition: A | Discharge status: Home / Self Care | Payer: Medicaid Other | Attending: Emergency Medicine | Admitting: Emergency Medicine

## 2016-09-17 DIAGNOSIS — Z791 Long term (current) use of non-steroidal anti-inflammatories (NSAID): Secondary | ICD-10-CM | POA: Diagnosis not present

## 2016-09-17 DIAGNOSIS — Z87891 Personal history of nicotine dependence: Secondary | ICD-10-CM | POA: Insufficient documentation

## 2016-09-17 DIAGNOSIS — Z79899 Other long term (current) drug therapy: Secondary | ICD-10-CM | POA: Insufficient documentation

## 2016-09-17 DIAGNOSIS — R0981 Nasal congestion: Secondary | ICD-10-CM | POA: Diagnosis present

## 2016-09-17 DIAGNOSIS — J111 Influenza due to unidentified influenza virus with other respiratory manifestations: Secondary | ICD-10-CM | POA: Insufficient documentation

## 2016-09-17 DIAGNOSIS — J45909 Unspecified asthma, uncomplicated: Secondary | ICD-10-CM | POA: Diagnosis not present

## 2016-09-17 NOTE — ED Triage Notes (Signed)
Seen 2 days ago diagnosed with Influenza. Pt states she feels like it has progressed to sinus infection. Denies fever. C/o nasal congestion and sneezing.

## 2016-09-17 NOTE — Discharge Instructions (Signed)
Follow-up with your primary care doctor if any continued problems. Continue Flonase nasal spray. Begin taking over-the-counter Zyrtec or Claritin Increase fluids. Tylenol or ibuprofen if needed for muscle aches, fever, chills, headache.

## 2016-09-17 NOTE — ED Notes (Signed)
See triage note   States she was seen 2 days ago and dx'd with the flu  Placed on tamiflu  But now having increased sinus pressure   Afebrile on arrival

## 2016-09-17 NOTE — ED Provider Notes (Signed)
[facility] Emergency Department Provider Note   ____________________________________________   First MD Initiated Contact with Patient 09/17/16 [phone]     (approximate)  I have reviewed the triage vital signs and the nursing notes.   HISTORY  Chief Complaint Influenza   HPI Ashley Fry is a 36 y.o. female is here with complaint of nasal congestion and sneezing. Patient states she feels as if she is getting a sinus infection. She was seen in the emergency room 2 days ago where she was diagnosed with influenza. Currently patient has taken Tamiflu but states that she doesn't feel like it is working. She was also given a prescription for Bromfed-DM and ibuprofen.  Patient also states that prior to being seen in the emergency room she was seen by her primary care provider who prescribed Flonase nasal spray which she continues to use 2 sprays each nostril once a day. Currently she rates her pain as 10 over 10.   Past Medical History:  Diagnosis Date  . Asthma   . Seizures ([place])     There are no active problems to display for this patient.   History reviewed. No pertinent surgical history.  Prior to Admission medications   Medication Sig Start Date End Date Taking? Authorizing Provider  albuterol-ipratropium (COMBIVENT) 18-103 MCG/ACT inhaler Inhale 2 puffs into the lungs every 6 (six) hours as needed for wheezing or shortness of breath.    Historical Provider, MD  brompheniramine-pseudoephedrine-DM 30-2-10 MG/5ML syrup Take 5 mLs by mouth 4 (four) times daily as needed. 09/15/16   Sable Feil, PA-C  ibuprofen (ADVIL,MOTRIN) 600 MG tablet Take 1 tablet (600 mg total) by mouth every 8 (eight) hours as needed. 09/15/16   Sable Feil, PA-C  oseltamivir (TAMIFLU) 75 MG capsule Take 1 capsule (75 mg total) by mouth 2 (two) times daily. 09/15/16 09/20/16  Sable Feil, PA-C    Allergies Patient has no known allergies.  No family history on file.  Social  History Social History  Substance Use Topics  . Smoking status: Former Smoker    Packs/day: 0.10    Types: Cigarettes  . Smokeless tobacco: Never Used  . Alcohol use No    Review of Systems Constitutional: Positive fever/chills Eyes: No visual changes. ENT: Positive sore throat. Positive sinus pressure. Positive congestion. Cardiovascular: Denies chest pain. Respiratory: Denies shortness of breath. Gastrointestinal: No abdominal pain.  No nausea, no vomiting.   Musculoskeletal: Generalized muscle aches. Skin: Negative for rash. Neurological: Negative for headaches, focal weakness or numbness.  10-point ROS otherwise negative.  ____________________________________________   PHYSICAL EXAM:  VITAL SIGNS: ED Triage Vitals  Enc Vitals Group     BP 09/17/16 0842 112/72     Pulse Rate 09/17/16 0842 75     Resp 09/17/16 0842 20     Temp 09/17/16 0842 97.7 F (36.5 C)     Temp Source 09/17/16 0842 Oral     SpO2 09/17/16 0842 100 %     Weight 09/17/16 0846 136 lb (61.7 kg)     Height 09/17/16 0846 5' (1.524 m)     Head Circumference --      Peak Flow --      Pain Score 09/17/16 0846 10     Pain Loc --      Pain Edu? --      Excl. in [place]? --     Constitutional: Alert and oriented. Well appearing and in no acute distress. Eyes: Conjunctivae are normal. PERRL.  EOMI. Head: Atraumatic.   Nose:Mild to moderate congestion/no rhinnorhea.   Nontender sinuses to percussion. Mouth/Throat: Mucous membranes are moist.  Oropharynx non-erythematous. Neck: No stridor.   Hematological/Lymphatic/Immunilogical: No cervical lymphadenopathy. Cardiovascular: Normal rate, regular rhythm. Grossly normal heart sounds.  Good peripheral circulation. Respiratory: Normal respiratory effort.  No retractions. Lungs CTAB. Gastrointestinal: Soft and nontender. No distention.  Musculoskeletal: Moves upper and lower extremities without any difficulty. Normal gait was noted. Neurologic:  Normal speech  and language. No gross focal neurologic deficits are appreciated. No gait instability. Skin:  Skin is warm, dry and intact. No rash noted. Psychiatric: Mood and affect are normal. Speech and behavior are normal.  ____________________________________________   LABS (all labs ordered are listed, but only abnormal results are displayed)  Labs Reviewed - No data to display  PROCEDURES  Procedure(s) performed: None  Procedures  Critical Care performed: No  ____________________________________________   INITIAL IMPRESSION / ASSESSMENT AND PLAN / ED COURSE  Pertinent labs & imaging results that were available during my care of the patient were reviewed by me and considered in my medical decision making (see chart for details).    Clinical Course    Patient was encouraged to continue taking Tamiflu and Bromfed-DM as needed for cough. Patient to continue taking ibuprofen and we discussed continuing Flonase for nasal congestion. She may also pickup Claritin or Zyrtec as needed for nasal congestion. She'll follow-up with Juanda Crumble to clinic if any continued problems. She will continue to increase her fluids and take ibuprofen or Tylenol as needed for muscle aches and headache. It was reemphasized to the patient that this medication is to not get red of the flu immediately.  ____________________________________________   FINAL CLINICAL IMPRESSION(S) / ED DIAGNOSES  Final diagnoses:  Influenza      NEW MEDICATIONS STARTED DURING THIS VISIT:  Discharge Medication List as of 09/17/2016 10:01 AM       Note:  This document was prepared using Dragon voice recognition software and may include unintentional dictation errors.    Johnn Hai, PA-C 09/17/16 1108    Orbie Pyo, MD 09/17/16 [phone]

## 2017-02-26 ENCOUNTER — Other Ambulatory Visit: Payer: Self-pay | Admitting: Ophthalmology

## 2017-02-26 DIAGNOSIS — H469 Unspecified optic neuritis: Secondary | ICD-10-CM

## 2017-03-05 ENCOUNTER — Ambulatory Visit: Payer: Medicaid Other

## 2017-03-09 ENCOUNTER — Ambulatory Visit: Admission: RE | Admit: 2017-03-09 | Payer: Medicaid Other | Source: Ambulatory Visit

## 2017-05-17 ENCOUNTER — Encounter: Payer: Self-pay | Admitting: Emergency Medicine

## 2017-05-17 ENCOUNTER — Emergency Department
Admission: EM | Admit: 2017-05-17 | Discharge: 2017-05-17 | Disposition: A | Discharge status: Home / Self Care | Payer: Medicaid Other | Attending: Emergency Medicine | Admitting: Emergency Medicine

## 2017-05-17 DIAGNOSIS — R202 Paresthesia of skin: Secondary | ICD-10-CM | POA: Insufficient documentation

## 2017-05-17 DIAGNOSIS — M70941 Unspecified soft tissue disorder related to use, overuse and pressure, right hand: Secondary | ICD-10-CM | POA: Insufficient documentation

## 2017-05-17 DIAGNOSIS — J45909 Unspecified asthma, uncomplicated: Secondary | ICD-10-CM | POA: Insufficient documentation

## 2017-05-17 DIAGNOSIS — S66919A Strain of unspecified muscle, fascia and tendon at wrist and hand level, unspecified hand, initial encounter: Secondary | ICD-10-CM

## 2017-05-17 DIAGNOSIS — IMO0001 Reserved for inherently not codable concepts without codable children: Secondary | ICD-10-CM

## 2017-05-17 DIAGNOSIS — Z87891 Personal history of nicotine dependence: Secondary | ICD-10-CM | POA: Insufficient documentation

## 2017-05-17 DIAGNOSIS — R209 Unspecified disturbances of skin sensation: Secondary | ICD-10-CM

## 2017-05-17 DIAGNOSIS — M79601 Pain in right arm: Secondary | ICD-10-CM | POA: Insufficient documentation

## 2017-05-17 DIAGNOSIS — Y9389 Activity, other specified: Secondary | ICD-10-CM | POA: Insufficient documentation

## 2017-05-17 DIAGNOSIS — R200 Anesthesia of skin: Secondary | ICD-10-CM | POA: Diagnosis present

## 2017-05-17 NOTE — ED Notes (Signed)
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

## 2017-05-17 NOTE — ED Triage Notes (Signed)
Pt to ed with c/o right arm numbness and pain x 6 months, rates 10/10, states she goes to donate plasma and feels it may be because of that.  Good grip strength noted bilat.   Pt with good ROM in right arm and hand.

## 2017-05-17 NOTE — ED Provider Notes (Signed)
[facility] Emergency Department Provider Note ____________________________________________  Time seen: 1748  I have reviewed the triage vital signs and the nursing notes.  HISTORY  Chief Complaint  Numbness  HPI Ashley Fry is a 36 y.o. female visits to the ED for evaluation of a 6 month complaint of intermittentright arm numbness and pain. The patient rates pain at a 10/10 in triage. She notes that she does donate plasma regularly, is concerned that her symptoms may be related to the repeated venipunctures. She denies any other injury, fever, chills, or sweats at this time. She denies any hand swelling,  or skin tear/color changes; but notes grip changes on the right. She has a history of a seizure disorder, and take Depakote daily.   Past Medical History:  Diagnosis Date  . Asthma   . Seizures ([place])     There are no active problems to display for this patient.  History reviewed. No pertinent surgical history.  Prior to Admission medications   Medication Sig Start Date End Date Taking? Authorizing Provider  albuterol-ipratropium (COMBIVENT) 18-103 MCG/ACT inhaler Inhale 2 puffs into the lungs every 6 (six) hours as needed for wheezing or shortness of breath.    [provider]  brompheniramine-pseudoephedrine-DM 30-2-10 MG/5ML syrup Take 5 mLs by mouth 4 (four) times daily as needed. 09/15/16   Sable Feil, PA-C  ibuprofen (ADVIL,MOTRIN) 600 MG tablet Take 1 tablet (600 mg total) by mouth every 8 (eight) hours as needed. 09/15/16   Sable Feil, PA-C   Allergies Patient has no known allergies.  History reviewed. No pertinent family history.  Social History Social History  Substance Use Topics  . Smoking status: Former Smoker    Packs/day: 0.10    Types: Cigarettes  . Smokeless tobacco: Never Used  . Alcohol use No    Review of Systems  Constitutional: Negative for fever. Cardiovascular: Negative for chest pain. Respiratory:  Negative for shortness of breath. Neurological: Negative for headaches, focal weakness. Reports numbness as above ____________________________________________  PHYSICAL EXAM:  VITAL SIGNS: ED Triage Vitals  Enc Vitals Group     BP 05/17/17 1652 109/60     Pulse Rate 05/17/17 1652 81     Resp 05/17/17 1652 18     Temp 05/17/17 1652 98.2 F (36.8 C)     Temp Source 05/17/17 1652 Oral     SpO2 05/17/17 1652 100 %     Weight 05/17/17 1653 136 lb (61.7 kg)     Height --      Head Circumference --      Peak Flow --      Pain Score 05/17/17 1652 10     Pain Loc --      Pain Edu? --      Excl. in [place]? --     Constitutional: Alert and oriented. Well appearing and in no distress. Head: Normocephalic and atraumatic. Neck: Supple. No thyromegaly. Cardiovascular: Normal rate, regular rhythm. Normal distal pulses. Respiratory: Normal respiratory effort. No wheezes/rales/rhonchi. Musculoskeletal: Right UE without obvious deformity, dislocation, edema, or effusion. Hand grip is decreased 3/5 versus left 5/5. Mildly tender to palp over the thenar prominence. Mildly positive Finkelstein. Nontender with normal range of motion in all other extremities.  Neurologic:  Normal gross sensation. Normal intrinsic & opposition testing. Normal speech and language. No gross focal neurologic deficits are appreciated. Skin:  Skin is warm, dry and intact. No rash noted. ____________________________________________  PROCEDURES  Wrist splint ____________________________________________  INITIAL IMPRESSION /  ASSESSMENT AND PLAN / ED COURSE  Patient ED evaluation of right upper extremity paresthesias prescription changes over the last 6 months. The patient is currently being considered for MRI with neurology symptoms related to MS. She is advised at this time to follow up with neurology for further evaluation management. She has declined a previous offer for ultrasound Doppler of the upper extremity, sighting  day care issues and concerns for a child that she left at home unattended. She will be discharged with a wrist cock-up splint and instructed to follow-up with neurology or return to the ED as needed. ____________________________________________  FINAL CLINICAL IMPRESSION(S) / ED DIAGNOSES  Final diagnoses:  Paresthesias/numbness  Paresthesia  Overuse syndrome of hand, initial encounter      Melvenia Needles, PA-C 05/17/17 1913    Delman Kitten, MD 05/17/17 2033

## 2017-05-17 NOTE — Discharge Instructions (Signed)
Your exam may represent a combination of overuse syndrome of the muscles and soft tissues of the forearm, and some mild nerve irritation. Follow-up with neurology as discussed. Return to the ED as needed.

## 2017-05-18 ENCOUNTER — Emergency Department
Admission: EM | Admit: 2017-05-18 | Discharge: 2017-05-18 | Disposition: A | Discharge status: Home / Self Care | Payer: Medicaid Other | Attending: Emergency Medicine | Admitting: Emergency Medicine

## 2017-05-18 DIAGNOSIS — R531 Weakness: Secondary | ICD-10-CM | POA: Diagnosis not present

## 2017-05-18 DIAGNOSIS — J45909 Unspecified asthma, uncomplicated: Secondary | ICD-10-CM | POA: Insufficient documentation

## 2017-05-18 DIAGNOSIS — R200 Anesthesia of skin: Secondary | ICD-10-CM | POA: Diagnosis present

## 2017-05-18 DIAGNOSIS — Z87891 Personal history of nicotine dependence: Secondary | ICD-10-CM | POA: Diagnosis not present

## 2017-05-18 DIAGNOSIS — Z79899 Other long term (current) drug therapy: Secondary | ICD-10-CM | POA: Diagnosis not present

## 2017-05-18 NOTE — Discharge Instructions (Signed)
Keep your appointment tomorrow at [facility].

## 2017-05-18 NOTE — ED Notes (Addendum)
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

## 2017-05-18 NOTE — ED Triage Notes (Signed)
Pt was seen here yesterday with intermittent right arm numbness for the past 6 months and today c/o swelling to the hand. Pt is wearing cockup splint on arrival. No noted neuro deficit

## 2017-05-18 NOTE — ED Provider Notes (Signed)
[facility] Emergency Department Provider Note  ____________________________________________   First MD Initiated Contact with Patient 05/18/17 0801     (approximate)  I have reviewed the triage vital signs and the nursing notes.   HISTORY  Chief Complaint Arm Pain   HPI Ashley Fry is a 36 y.o. female procedure the emergency room yesterday for intermittent right arm numbness for the last 6 months. Patient states today she feels that her hand is swollen. She was given a cockup wrist splint yesterday which has not helped. She denies any worsening of her symptoms from yesterday. She also gives a history of intermittent numbness on the right side of her face going down to her right arm and her right leg. Patient denies any injuries. She also states that she has an appointment with the neurologist Dr.Shah at [facility] tomorrow at 8:30.   Past Medical History:  Diagnosis Date  . Asthma   . Seizures ([place])     There are no active problems to display for this patient.   History reviewed. No pertinent surgical history.  Prior to Admission medications   Medication Sig Start Date End Date Taking? Authorizing Provider  albuterol-ipratropium (COMBIVENT) 18-103 MCG/ACT inhaler Inhale 2 puffs into the lungs every 6 (six) hours as needed for wheezing or shortness of breath.    [provider]  brompheniramine-pseudoephedrine-DM 30-2-10 MG/5ML syrup Take 5 mLs by mouth 4 (four) times daily as needed. 09/15/16   Sable Feil, PA-C  ibuprofen (ADVIL,MOTRIN) 600 MG tablet Take 1 tablet (600 mg total) by mouth every 8 (eight) hours as needed. 09/15/16   Sable Feil, PA-C    Allergies Patient has no known allergies.  No family history on file.  Social History Social History  Substance Use Topics  . Smoking status: Former Smoker    Packs/day: 0.10    Types: Cigarettes  . Smokeless tobacco: Never Used  . Alcohol use No    Review of  Systems Constitutional: No fever/chills Eyes: No visual changes. Cardiovascular: Denies chest pain. Respiratory: Denies shortness of breath. Gastrointestinal: No abdominal pain.  No nausea, no vomiting.  Musculoskeletal: Negative for back pain. Skin: Negative for rash. Neurological: Negative for headache. Positive for subjective numbness right side of face, right upper extremity and right lower extremity. ____________________________________________   PHYSICAL EXAM:  VITAL SIGNS: ED Triage Vitals  Enc Vitals Group     BP 05/18/17 0754 (!) 109/94     Pulse Rate 05/18/17 0754 78     Resp 05/18/17 0754 16     Temp 05/18/17 0754 98 F (36.7 C)     Temp Source 05/18/17 0754 Oral     SpO2 05/18/17 0754 98 %     Weight 05/18/17 0755 136 lb (61.7 kg)     Height --      Head Circumference --      Peak Flow --      Pain Score 05/18/17 0757 10     Pain Loc --      Pain Edu? --      Excl. in [place]? --    Constitutional: Alert and oriented. Well appearing and in no acute distress. Eyes: Conjunctivae are normal.  Head: Atraumatic. Neck: No stridor.   Cardiovascular: Normal rate, regular rhythm. Grossly normal heart sounds.  Good peripheral circulation. Respiratory: Normal respiratory effort.  No retractions. Lungs CTAB. Musculoskeletal: Moves upper and lower extremities without difficulty and patient has a normal gait without any assistance. In comparison  with the left arm there is no gross deformity or soft tissue swelling noted. There is some minimal grip strength differences between left and the right. Patient still continues to be able to move in flexion and extension without any assistance. There is no warmth or redness to the upper extremity. No swelling or edema is noted to the lower extremities. Neurologic:  Normal speech and language. No gross focal neurologic deficits are appreciated. Cranial nerves II through XII grossly intact. Skin:  Skin is warm, dry and intact. No rash  noted. Psychiatric: Mood and affect are normal. Speech and behavior are normal.  ____________________________________________   LABS (all labs ordered are listed, but only abnormal results are displayed)  Labs Reviewed - No data to display  PROCEDURES  Procedure(s) performed: None  Procedures  Critical Care performed: No  ____________________________________________   INITIAL IMPRESSION / ASSESSMENT AND PLAN / ED COURSE  Pertinent labs & imaging results that were available during my care of the patient were reviewed by me and considered in my medical decision making (see chart for details).  Patient has history of intermittent right sided numbness going on for approximately 6 months. The patient is no acute distress today and was seen yesterday with no changes in today's exam. Patient is encouraged to keep her appointment with Dr. Manuella Ghazi at 8:30am . Patient was given a note for work today.    ___________________________________________   FINAL CLINICAL IMPRESSION(S) / ED DIAGNOSES  Final diagnoses:  Numbness  Right sided weakness      NEW MEDICATIONS STARTED DURING THIS VISIT:  Discharge Medication List as of 05/18/2017  9:11 AM       Note:  This document was prepared using Dragon voice recognition software and may include unintentional dictation errors.    Johnn Hai, PA-C 05/18/17 [place], [place], MD 05/19/17 [phone]

## 2017-05-19 DIAGNOSIS — R202 Paresthesia of skin: Secondary | ICD-10-CM

## 2017-05-19 DIAGNOSIS — R519 Headache, unspecified: Secondary | ICD-10-CM | POA: Insufficient documentation

## 2017-05-19 DIAGNOSIS — R200 Anesthesia of skin: Secondary | ICD-10-CM | POA: Insufficient documentation

## 2017-05-19 DIAGNOSIS — R51 Headache: Secondary | ICD-10-CM

## 2017-05-20 DIAGNOSIS — G35 Multiple sclerosis: Secondary | ICD-10-CM | POA: Insufficient documentation

## 2017-05-25 ENCOUNTER — Other Ambulatory Visit: Payer: Self-pay | Admitting: Neurology

## 2017-05-25 DIAGNOSIS — G35 Multiple sclerosis: Secondary | ICD-10-CM

## 2017-06-01 ENCOUNTER — Ambulatory Visit: Payer: Medicaid Other

## 2017-06-09 DIAGNOSIS — M654 Radial styloid tenosynovitis [de Quervain]: Secondary | ICD-10-CM | POA: Insufficient documentation

## 2017-06-14 ENCOUNTER — Ambulatory Visit: Admission: RE | Admit: 2017-06-14 | Payer: Medicaid Other | Source: Ambulatory Visit

## 2017-10-18 ENCOUNTER — Ambulatory Visit: Payer: Self-pay | Admitting: Obstetrics and Gynecology

## 2017-11-19 ENCOUNTER — Telehealth: Payer: Self-pay | Admitting: Obstetrics and Gynecology

## 2017-11-19 NOTE — Telephone Encounter (Signed)
Noted. Will order to arrive by apt date/time. 

## 2017-11-19 NOTE — Telephone Encounter (Signed)
Pt is schedule 12/13/17 with [facility] for mirena removal and insertion

## 2017-11-25 ENCOUNTER — Encounter: Payer: Self-pay | Admitting: Emergency Medicine

## 2017-11-25 ENCOUNTER — Emergency Department: Payer: Medicaid Other

## 2017-11-25 ENCOUNTER — Emergency Department
Admission: EM | Admit: 2017-11-25 | Discharge: 2017-11-25 | Disposition: A | Discharge status: Home / Self Care | Payer: Medicaid Other | Attending: Emergency Medicine | Admitting: Emergency Medicine

## 2017-11-25 DIAGNOSIS — J45909 Unspecified asthma, uncomplicated: Secondary | ICD-10-CM | POA: Diagnosis not present

## 2017-11-25 DIAGNOSIS — Z87891 Personal history of nicotine dependence: Secondary | ICD-10-CM | POA: Diagnosis not present

## 2017-11-25 DIAGNOSIS — J45901 Unspecified asthma with (acute) exacerbation: Secondary | ICD-10-CM | POA: Diagnosis not present

## 2017-11-25 DIAGNOSIS — Z79899 Other long term (current) drug therapy: Secondary | ICD-10-CM | POA: Insufficient documentation

## 2017-11-25 DIAGNOSIS — R05 Cough: Secondary | ICD-10-CM | POA: Diagnosis present

## 2017-11-25 LAB — COMPREHENSIVE METABOLIC PANEL
ALT: 18 U/L (ref 14–54)
AST: 25 U/L (ref 15–41)
Albumin: 4.2 g/dL (ref 3.5–5.0)
Alkaline Phosphatase: 37 U/L — ABNORMAL LOW (ref 38–126)
Anion gap: 9 (ref 5–15)
BUN: 17 mg/dL (ref 6–20)
CO2: 21 mmol/L — ABNORMAL LOW (ref 22–32)
Calcium: 9 mg/dL (ref 8.9–10.3)
Chloride: 107 mmol/L (ref 101–111)
Creatinine, Ser: 0.8 mg/dL (ref 0.44–1.00)
GFR calc Af Amer: >60 mL/min (ref >60)
GFR calc non Af Amer: >60 mL/min (ref >60)
Glucose, Bld: 89 mg/dL (ref 65–99)
Potassium: 4.1 mmol/L (ref 3.5–5.1)
Sodium: 137 mmol/L (ref 135–145)
Total Bilirubin: 0.7 mg/dL (ref 0.3–1.2)
Total Protein: 7.5 g/dL (ref 6.5–8.1)

## 2017-11-25 LAB — CBC WITH DIFFERENTIAL/PLATELET
Basophils Absolute: 0.1 10*3/uL (ref 0–0.1)
Basophils Relative: 1 %
Eosinophils Absolute: 0.2 10*3/uL (ref 0–0.7)
Eosinophils Relative: 3 %
HCT: 37.7 % (ref 35.0–47.0)
Hemoglobin: 12 g/dL (ref 12.0–16.0)
Lymphocytes Relative: 34 %
Lymphs Abs: 2.6 10*3/uL (ref 1.0–3.6)
MCH: 24.4 pg — ABNORMAL LOW (ref 26.0–34.0)
MCHC: 31.7 g/dL — ABNORMAL LOW (ref 32.0–36.0)
MCV: 77 fL — ABNORMAL LOW (ref 80.0–100.0)
Monocytes Absolute: 0.7 10*3/uL (ref 0.2–0.9)
Monocytes Relative: 9 %
Neutro Abs: 4 10*3/uL (ref 1.4–6.5)
Neutrophils Relative %: 53 %
Platelets: 269 10*3/uL (ref 150–440)
RBC: 4.89 MIL/uL (ref 3.80–5.20)
RDW: 15.3 % — ABNORMAL HIGH (ref 11.5–14.5)
WBC: 7.7 10*3/uL (ref 3.6–11.0)

## 2017-11-25 MED ORDER — PREDNISONE 50 MG PO TABS: ORAL_TABLET | ORAL | 0 refills | Status: DC

## 2017-11-25 MED ORDER — ALBUTEROL SULFATE (2.5 MG/3ML) 0.083% IN NEBU
INHALATION_SOLUTION | RESPIRATORY_TRACT | Status: AC
  Administered 2017-11-25: 5 mg via RESPIRATORY_TRACT
  Filled 2017-11-25: qty 6

## 2017-11-25 MED ORDER — IBUPROFEN 400 MG PO TABS
400.0000 mg | ORAL_TABLET | Freq: Once | ORAL | Status: AC
  Administered 2017-11-25: 400 mg via ORAL
  Filled 2017-11-25: qty 1

## 2017-11-25 MED ORDER — CYCLOBENZAPRINE HCL 10 MG PO TABS
5.0000 mg | ORAL_TABLET | Freq: Once | ORAL | Status: AC
  Administered 2017-11-25: 5 mg via ORAL
  Filled 2017-11-25: qty 1

## 2017-11-25 MED ORDER — ALBUTEROL SULFATE (2.5 MG/3ML) 0.083% IN NEBU
5.0000 mg | INHALATION_SOLUTION | Freq: Once | RESPIRATORY_TRACT | Status: AC
  Administered 2017-11-25: 5 mg via RESPIRATORY_TRACT

## 2017-11-25 MED ORDER — IPRATROPIUM-ALBUTEROL 0.5-2.5 (3) MG/3ML IN SOLN
9.0000 mL | Freq: Once | RESPIRATORY_TRACT | Status: AC
  Administered 2017-11-25: 9 mL via RESPIRATORY_TRACT
  Filled 2017-11-25: qty 9

## 2017-11-25 MED ORDER — PREDNISONE 20 MG PO TABS
60.0000 mg | ORAL_TABLET | Freq: Once | ORAL | Status: AC
Start: 2017-11-25 — End: 2017-11-25
  Administered 2017-11-25: 60 mg via ORAL
  Filled 2017-11-25: qty 3

## 2017-11-25 NOTE — ED Notes (Addendum)
Pt verbalizes d/c understanding and follow up with RX. Pt in NAD at time of departure, ambulatory, VSS . PT unable to sign discharge signature due to topaz malfnx

## 2017-11-25 NOTE — ED Provider Notes (Addendum)
[facility] Emergency Department Provider Note  ___________________________________________   First MD Initiated Contact with Patient 11/25/17 1412     (approximate)  I have reviewed the triage vital signs and the nursing notes.   HISTORY  Chief Complaint Cough and Shortness of Breath   HPI Ashley Fry is a 37 y.o. female a history of asthma as well as seizure disorder who is presenting to the emergency department 2 weeks of cough as well as shortness of breath.  She says that she smokes about 1 pack of cigarettes per day.  Also reports of chest tightness.  Denies any fever or known sick contacts.  Denies any nasal congestion.  Says that she has had multiple instances in the past that have been somewhat similar which she attributes to smoking and is suspecting that she may have early COPD.  Past Medical History:  Diagnosis Date  . Asthma   . Seizures ([place])     There are no active problems to display for this patient.   History reviewed. No pertinent surgical history.  Prior to Admission medications   Medication Sig Start Date End Date Taking? Authorizing Provider  albuterol-ipratropium (COMBIVENT) 18-103 MCG/ACT inhaler Inhale 2 puffs into the lungs every 6 (six) hours as needed for wheezing or shortness of breath.    [provider]  brompheniramine-pseudoephedrine-DM 30-2-10 MG/5ML syrup Take 5 mLs by mouth 4 (four) times daily as needed. 09/15/16   Sable Feil, PA-C  ibuprofen (ADVIL,MOTRIN) 600 MG tablet Take 1 tablet (600 mg total) by mouth every 8 (eight) hours as needed. 09/15/16   Sable Feil, PA-C    Allergies Patient has no known allergies.  No family history on file.  Social History Social History   Tobacco Use  . Smoking status: Former Smoker    Packs/day: 0.10    Types: Cigarettes  . Smokeless tobacco: Never Used  Substance Use Topics  . Alcohol use: No  . Drug use: No    Review of  Systems  Constitutional: No fever/chills Eyes: No visual changes. ENT: No sore throat. Cardiovascular: Denies chest pain. Respiratory: as above Gastrointestinal: No abdominal pain.  No nausea, no vomiting.  No diarrhea.  No constipation. Genitourinary: Negative for dysuria. Musculoskeletal: Negative for back pain. Skin: Negative for rash. Neurological: Negative for headaches, focal weakness or numbness.   ____________________________________________   PHYSICAL EXAM:  VITAL SIGNS: ED Triage Vitals  Enc Vitals Group     BP 11/25/17 1236 130/90     Pulse Rate 11/25/17 1236 85     Resp 11/25/17 1236 20     Temp 11/25/17 1236 (!) 97.5 F (36.4 C)     Temp Source 11/25/17 1236 Oral     SpO2 11/25/17 1236 99 %     Weight 11/25/17 1240 138 lb (62.6 kg)     Height 11/25/17 1240 5' (1.524 m)     Head Circumference --      Peak Flow --      Pain Score 11/25/17 1240 8     Pain Loc --      Pain Edu? --      Excl. in [place]? --     Constitutional: Alert and oriented. Well appearing and in no acute distress. Eyes: Conjunctivae are normal.  Head: Atraumatic. Nose: Mild amount of dried mucus to the bilateral nares. Mouth/Throat: Mucous membranes are moist.  No pharyngeal erythema or tonsillar swelling or exudate. Neck: No stridor.   Cardiovascular: Normal rate, regular  rhythm. Grossly normal heart sounds.  Good peripheral circulation. Respiratory: Normal respiratory effort.  No retractions.  Speaks in full sentences but then has a prolonged respiratory phase with a recovery of breath.  Wheezing throughout all fields which is minimal.  No retractions or respiratory distress. Gastrointestinal: Soft and nontender. No distention.  Musculoskeletal: No lower extremity tenderness nor edema.  No joint effusions. Neurologic:  Normal speech and language. No gross focal neurologic deficits are appreciated. Skin:  Skin is warm, dry and intact. No rash noted. Psychiatric: Mood and affect are normal.  Speech and behavior are normal.  ____________________________________________   LABS (all labs ordered are listed, but only abnormal results are displayed)  Labs Reviewed  CBC WITH DIFFERENTIAL/PLATELET - Abnormal; Notable for the following components:      Result Value   MCV 77.0 (*)    MCH 24.4 (*)    MCHC 31.7 (*)    RDW 15.3 (*)    All other components within normal limits  COMPREHENSIVE METABOLIC PANEL - Abnormal; Notable for the following components:   CO2 21 (*)    Alkaline Phosphatase 37 (*)    All other components within normal limits   ____________________________________________  EKG  ED ECG REPORT I, Doran Stabler, the attending physician, personally viewed and interpreted this ECG.   Date: 11/25/2017  EKG Time: 1255  Rate: 72  Rhythm: normal sinus rhythm  Axis: Normal  Intervals:none  ST&T Change: Single biphasic T wave in V3.  Otherwise, there are no ST elevations or depressions.  No abnormal T wave inversions.  No significant change from previous EKG in the record of October 16, 2015  ____________________________________________  RADIOLOGY  No acute finding on the chest x-ray ____________________________________________   PROCEDURES  Procedure(s) performed:   Procedures  Critical Care performed:   ____________________________________________   INITIAL IMPRESSION / ASSESSMENT AND PLAN / ED COURSE  Pertinent labs & imaging results that were available during my care of the patient were reviewed by me and considered in my medical decision making (see chart for details).  Differential includes, but is not limited to, viral syndrome, bronchitis including COPD exacerbation, pneumonia, reactive airway disease including asthma, CHF including exacerbation with or without pulmonary/interstitial edema, pneumothorax, ACS, thoracic trauma, and pulmonary embolism. As part of my medical decision making, I reviewed the following data within the electronic  MEDICAL RECORD NUMBER Notes from prior ED visits  ----------------------------------------- 3:34 PM on 11/25/2017 -----------------------------------------  Patient at this time without wheezing.  No longer having a prolonged expiratory phase.  Says that she is no longer with an inhaler at home.  I will prescribe an inhaler as well as a steroid course.  We also discussed smoking cessation.  She will be following up with her primary care doctor.  Is understanding of the plan and willing to comply. ____________________________________________   FINAL CLINICAL IMPRESSION(S) / ED DIAGNOSES  Asthma exacerbation.  Bronchospasm.    NEW MEDICATIONS STARTED DURING THIS VISIT:  New Prescriptions   No medications on file     Note:  This document was prepared using Dragon voice recognition software and may include unintentional dictation errors.     Orbie Pyo, MD 11/25/17 1534  Patient now reporting muscle spasms to the right side of her chest pain we will give a dose of ibuprofen as well as Flexeril.    Orbie Pyo, MD 11/25/17 1537

## 2017-11-25 NOTE — ED Triage Notes (Signed)
Pt arrived with complaints of right side rib cage pain that is amplified with coughing and deep breathing. Pt states the pain makes her feel short of breath. Pt's oxygen saturation 100% in triage. Pt's rib cage tender upon palpation but no deformity noted. Pt denies any known injury. Pt has had dry cough for over two months but denies any OTC medication use.

## 2017-12-04 ENCOUNTER — Encounter: Payer: Self-pay | Admitting: Emergency Medicine

## 2017-12-04 ENCOUNTER — Other Ambulatory Visit: Payer: Self-pay

## 2017-12-04 ENCOUNTER — Emergency Department
Admission: EM | Admit: 2017-12-04 | Discharge: 2017-12-04 | Disposition: A | Discharge status: Home / Self Care | Payer: Medicaid Other | Attending: Emergency Medicine | Admitting: Emergency Medicine

## 2017-12-04 ENCOUNTER — Emergency Department: Payer: Medicaid Other

## 2017-12-04 DIAGNOSIS — J4521 Mild intermittent asthma with (acute) exacerbation: Secondary | ICD-10-CM | POA: Diagnosis not present

## 2017-12-04 DIAGNOSIS — Z87891 Personal history of nicotine dependence: Secondary | ICD-10-CM | POA: Diagnosis not present

## 2017-12-04 DIAGNOSIS — R05 Cough: Secondary | ICD-10-CM | POA: Diagnosis present

## 2017-12-04 LAB — FIBRIN DERIVATIVES D-DIMER (ARMC ONLY): Fibrin derivatives D-dimer (ARMC): 383.35 ng/mL (FEU) (ref 0.00–499.00)

## 2017-12-04 LAB — COMPREHENSIVE METABOLIC PANEL
ALT: 20 U/L (ref 14–54)
AST: 33 U/L (ref 15–41)
Albumin: 4.3 g/dL (ref 3.5–5.0)
Alkaline Phosphatase: 49 U/L (ref 38–126)
Anion gap: 10 (ref 5–15)
BUN: 14 mg/dL (ref 6–20)
CO2: 21 mmol/L — ABNORMAL LOW (ref 22–32)
Calcium: 9.4 mg/dL (ref 8.9–10.3)
Chloride: 105 mmol/L (ref 101–111)
Creatinine, Ser: 0.97 mg/dL (ref 0.44–1.00)
GFR calc Af Amer: >60 mL/min (ref >60)
GFR calc non Af Amer: >60 mL/min (ref >60)
Glucose, Bld: 116 mg/dL — ABNORMAL HIGH (ref 65–99)
Potassium: 3.2 mmol/L — ABNORMAL LOW (ref 3.5–5.1)
Sodium: 136 mmol/L (ref 135–145)
Total Bilirubin: 0.7 mg/dL (ref 0.3–1.2)
Total Protein: 7.8 g/dL (ref 6.5–8.1)

## 2017-12-04 LAB — CBC WITH DIFFERENTIAL/PLATELET
Basophils Absolute: 0.1 10*3/uL (ref 0–0.1)
Basophils Relative: 1 %
Eosinophils Absolute: 0.2 10*3/uL (ref 0–0.7)
Eosinophils Relative: 4 %
HCT: 38.8 % (ref 35.0–47.0)
Hemoglobin: 12.6 g/dL (ref 12.0–16.0)
Lymphocytes Relative: 30 %
Lymphs Abs: 1.6 10*3/uL (ref 1.0–3.6)
MCH: 24.5 pg — ABNORMAL LOW (ref 26.0–34.0)
MCHC: 32.5 g/dL (ref 32.0–36.0)
MCV: 75.4 fL — ABNORMAL LOW (ref 80.0–100.0)
Monocytes Absolute: 1 10*3/uL — ABNORMAL HIGH (ref 0.2–0.9)
Monocytes Relative: 19 %
Neutro Abs: 2.5 10*3/uL (ref 1.4–6.5)
Neutrophils Relative %: 46 %
Platelets: 226 10*3/uL (ref 150–440)
RBC: 5.14 MIL/uL (ref 3.80–5.20)
RDW: 14.8 % — ABNORMAL HIGH (ref 11.5–14.5)
WBC: 5.4 10*3/uL (ref 3.6–11.0)

## 2017-12-04 LAB — TROPONIN I: Troponin I: <0.03 ng/mL (ref <0.03)

## 2017-12-04 MED ORDER — IPRATROPIUM-ALBUTEROL 0.5-2.5 (3) MG/3ML IN SOLN
3.0000 mL | Freq: Once | RESPIRATORY_TRACT | Status: AC
  Administered 2017-12-04: 3 mL via RESPIRATORY_TRACT
  Filled 2017-12-04: qty 3

## 2017-12-04 MED ORDER — AZITHROMYCIN 250 MG PO TABS: ORAL_TABLET | ORAL | 0 refills | Status: AC

## 2017-12-04 MED ORDER — LORAZEPAM 2 MG/ML IJ SOLN
1.0000 mg | Freq: Once | INTRAMUSCULAR | Status: AC
  Administered 2017-12-04: 1 mg via INTRAVENOUS
  Filled 2017-12-04: qty 1

## 2017-12-04 MED ORDER — PREDNISONE 20 MG PO TABS: 20.0000 mg | ORAL_TABLET | Freq: Every day | ORAL | 0 refills | Status: DC

## 2017-12-04 MED ORDER — IPRATROPIUM-ALBUTEROL 0.5-2.5 (3) MG/3ML IN SOLN
RESPIRATORY_TRACT | Status: AC
  Administered 2017-12-04: 3 mL via RESPIRATORY_TRACT
  Filled 2017-12-04: qty 3

## 2017-12-04 MED ORDER — METHYLPREDNISOLONE SODIUM SUCC 125 MG IJ SOLR
125.0000 mg | Freq: Once | INTRAMUSCULAR | Status: AC
  Administered 2017-12-04: 125 mg via INTRAVENOUS
  Filled 2017-12-04: qty 2

## 2017-12-04 MED ORDER — ALBUTEROL SULFATE HFA 108 (90 BASE) MCG/ACT IN AERS: 2.0000 | INHALATION_SPRAY | Freq: Four times a day (QID) | RESPIRATORY_TRACT | 2 refills | Status: DC | PRN

## 2017-12-04 MED ORDER — IPRATROPIUM-ALBUTEROL 0.5-2.5 (3) MG/3ML IN SOLN
3.0000 mL | Freq: Once | RESPIRATORY_TRACT | Status: AC
  Administered 2017-12-04: 3 mL via RESPIRATORY_TRACT

## 2017-12-04 MED ORDER — MAGNESIUM SULFATE 2 GM/50ML IV SOLN
2.0000 g | Freq: Once | INTRAVENOUS | Status: AC
  Administered 2017-12-04: 2 g via INTRAVENOUS
  Filled 2017-12-04: qty 50

## 2017-12-04 NOTE — ED Notes (Signed)
Ambulated pt per MD order.  Pt made it a few steps out of room and felt dizzy with tightness in chest.  Pulse ox stayed around 99-100%.  MD aware. No new orders at this time, will continue to monitor.

## 2017-12-04 NOTE — ED Notes (Signed)
Pt states " I'm having trouble breathing and I'm coughing".  MD aware. New orders for Duoneb treatment placed

## 2017-12-04 NOTE — ED Triage Notes (Signed)
Pt c/o cough for 8 weeks, pt able to speak in complete sentences without running out of breath, but does start to cough. Audible wheezing.  Cough productive at times yellow phlegm.  Pt not currently using an inhaler for sxs Pt has hx of asthma

## 2017-12-04 NOTE — ED Provider Notes (Signed)
[facility] Emergency Department Provider Note   ____________________________________________   First MD Initiated Contact with Patient 12/04/17 0935     (approximate)  I have reviewed the triage vital signs and the nursing notes.   HISTORY  Chief Complaint Wheezing and Shortness of Breath    HPI Ashley Fry is a 37 y.o. female Who complains of several weeks of cough productive of small amounts of green phlegm chest tightness and wheezing. She has a history of asthma but thinks that this is worse and different. She has some chest tightness with the wheezing. She denies any fever. She does smoke. Although she is trying to cut back.   Past Medical History:  Diagnosis Date  . Asthma   . Seizures ([place])     There are no active problems to display for this patient.   History reviewed. No pertinent surgical history.  Prior to Admission medications   Medication Sig Start Date End Date Taking? Authorizing Provider  albuterol (PROVENTIL HFA;VENTOLIN HFA) 108 (90 Base) MCG/ACT inhaler Inhale 2 puffs into the lungs every 6 (six) hours as needed for wheezing or shortness of breath. 12/04/17   Nena Polio, MD  albuterol-ipratropium (COMBIVENT) 18-103 MCG/ACT inhaler Inhale 2 puffs into the lungs every 6 (six) hours as needed for wheezing or shortness of breath.    [provider]  azithromycin (ZITHROMAX Z-PAK) 250 MG tablet Take 2 tablets (500 mg) on  Day 1,  followed by 1 tablet (250 mg) once daily on Days 2 through 5. 12/04/17 12/09/17  Nena Polio, MD  brompheniramine-pseudoephedrine-DM 30-2-10 MG/5ML syrup Take 5 mLs by mouth 4 (four) times daily as needed. Patient not taking: Reported on 12/04/2017 09/15/16   Sable Feil, PA-C  ibuprofen (ADVIL,MOTRIN) 600 MG tablet Take 1 tablet (600 mg total) by mouth every 8 (eight) hours as needed. Patient not taking: Reported on 12/04/2017 09/15/16   Sable Feil, PA-C  predniSONE (DELTASONE) 20 MG  tablet Take 1 tablet (20 mg total) by mouth daily. 12/04/17 12/04/18  Nena Polio, MD    Allergies Patient has no known allergies.  History reviewed. No pertinent family history.  Social History Social History   Tobacco Use  . Smoking status: Former Smoker    Packs/day: 0.10    Types: Cigarettes  . Smokeless tobacco: Never Used  Substance Use Topics  . Alcohol use: No  . Drug use: No    Review of Systems  Constitutional: No fever/chills Eyes: No visual changes. ENT: No sore throat. Cardiovascular:  chest pain. Respiratory:  shortness of breath. Gastrointestinal: No abdominal pain.  No nausea, no vomiting.  No diarrhea.  No constipation. Genitourinary: Negative for dysuria. Musculoskeletal: Negative for back pain. Skin: Negative for rash. Neurological: Negative for headaches, focal weakness   ____________________________________________   PHYSICAL EXAM:  VITAL SIGNS: ED Triage Vitals  Enc Vitals Group     BP 12/04/17 0911 (!) 154/84     Pulse Rate 12/04/17 0911 (!) 106     Resp 12/04/17 0911 16     Temp 12/04/17 0911 98.7 F (37.1 C)     Temp Source 12/04/17 0911 Axillary     SpO2 12/04/17 0911 99 %     Weight 12/04/17 0909 138 lb (62.6 kg)     Height 12/04/17 0909 5' (1.524 m)     Head Circumference --      Peak Flow --      Pain Score --  Pain Loc --      Pain Edu? --      Excl. in [place]? --     Constitutional: Alert and oriented. Well appearing and in no acute distress. Eyes: Conjunctivae are normal.  Head: Atraumatic. Nose: No congestion/rhinnorhea. Mouth/Throat: Mucous membranes are moist.  Oropharynx non-erythematous. Neck: No stridor.  Cardiovascular: Normal rate, regular rhythm. Grossly normal heart sounds.  Good peripheral circulation. Respiratory: Normal respiratory effort.  No retractions. Lungs sound tight and there are diffuse wheezes and somewhat decreased air movement Gastrointestinal: Soft and nontender. No distention. No abdominal  bruits. No CVA tenderness. Musculoskeletal: No lower extremity tenderness nor edema.  No joint effusions. Neurologic:  Normal speech and language. No gross focal neurologic deficits are appreciated.  Skin:  Skin is warm, dry and intact. No rash noted. Psychiatric: Mood and affect are normal. Speech and behavior are normal.  ____________________________________________   LABS (all labs ordered are listed, but only abnormal results are displayed)  Labs Reviewed  COMPREHENSIVE METABOLIC PANEL - Abnormal; Notable for the following components:      Result Value   Potassium 3.2 (*)    CO2 21 (*)    Glucose, Bld 116 (*)    All other components within normal limits  CBC WITH DIFFERENTIAL/PLATELET - Abnormal; Notable for the following components:   MCV 75.4 (*)    MCH 24.5 (*)    RDW 14.8 (*)    Monocytes Absolute 1.0 (*)    All other components within normal limits  TROPONIN I  FIBRIN DERIVATIVES D-DIMER ([facility] ONLY)   ____________________________________________  EKG  EKG read and interpreted by me shows sinus tachycardia rate of 104 normal axis nonspecific EKG changes similar to what was present on the last EKG.     ____________________________________________  RADIOLOGY  ED MD interpretation:    Official radiology report(s): Dg Chest 2 View  Result Date: 12/04/2017 CLINICAL DATA:  Cough for 8 weeks, shortness of breath, audible wheezing, cough at times productive of yellow phlegm, history asthma, former smoker EXAM: CHEST - 2 VIEW COMPARISON:  11/25/2017 FINDINGS: Normal heart size, mediastinal contours, and pulmonary vascularity. Lungs clear. No pleural effusion or pneumothorax. Bones unremarkable. IMPRESSION: Normal exam. Electronically Signed   By: Lavonia Dana M.D.   On: 12/04/2017 09:54    ____________________________________________   PROCEDURES  Procedure(s) performed:   Procedures  Critical Care performed:    ____________________________________________   INITIAL IMPRESSION / ASSESSMENT AND PLAN / ED COURSE  patient's peak flow was 300. Patient was able to walk without desaturating. Her heart rate did go up into the 1 teens when she is walking but went down afterwards. Patient thinks she can go home I will let her try.  She will come back if she is worse. She is not wheezing any longer.       ____________________________________________   FINAL CLINICAL IMPRESSION(S) / ED DIAGNOSES  Final diagnoses:  Exacerbation of intermittent asthma, unspecified asthma severity     ED Discharge Orders        Ordered    albuterol (PROVENTIL HFA;VENTOLIN HFA) 108 (90 Base) MCG/ACT inhaler  Every 6 hours PRN     12/04/17 1554    predniSONE (DELTASONE) 20 MG tablet  Daily     12/04/17 1554    azithromycin (ZITHROMAX Z-PAK) 250 MG tablet     12/04/17 1554       Note:  This document was prepared using Dragon voice recognition software and may include unintentional dictation errors.  Nena Polio, MD 12/04/17 1556

## 2017-12-04 NOTE — Discharge Instructions (Addendum)
take the antibiotic Zithromax as directed. Take the prednisone one a day starting today told her gone. Use the albuterol inhaler 2 puffs 4 times a day as needed. Return if you're worse at all. Follow up with your doctor or see the [facility], or the [facility] or the [facility] clinic or the [facility] or [facility] [employer] care

## 2017-12-04 NOTE — ED Notes (Signed)
Peak flow measured at 200.

## 2017-12-13 ENCOUNTER — Ambulatory Visit: Payer: Self-pay | Admitting: Obstetrics and Gynecology

## 2017-12-14 NOTE — Telephone Encounter (Signed)
Mirena was reserved for this patient. Pt no showed for apt. Device put back in stock. Will need to reserve again should pt r/s apt.

## 2017-12-28 NOTE — Telephone Encounter (Signed)
Patient is reschedule for 01/24/18 at 3:30 with ABC

## 2018-01-11 NOTE — Telephone Encounter (Signed)
Noted. Will order to arrive by apt date/time. 

## 2018-01-24 ENCOUNTER — Encounter: Payer: Self-pay | Admitting: Obstetrics and Gynecology

## 2018-01-24 ENCOUNTER — Ambulatory Visit (INDEPENDENT_AMBULATORY_CARE_PROVIDER_SITE_OTHER): Payer: Medicaid Other | Admitting: Obstetrics and Gynecology

## 2018-01-24 VITALS — BP 122/80 | HR 83 | Ht 60.0 in | Wt 142.0 lb

## 2018-01-24 DIAGNOSIS — Z30433 Encounter for removal and reinsertion of intrauterine contraceptive device: Secondary | ICD-10-CM

## 2018-01-24 MED ORDER — LEVONORGESTREL 20 MCG/24HR IU IUD
INTRAUTERINE_SYSTEM | Freq: Once | INTRAUTERINE | Status: AC
Start: 2018-01-24 — End: ?

## 2018-01-24 NOTE — Progress Notes (Signed)
   Chief Complaint  Patient presents with  . Contraception    IUD removal/Reinsert      History of Present Illness:  Ashley Fry is a 37 y.o. that had a Mirena IUD placed approximately 5 years ago. Since that time, she denies dyspareunia, pelvic pain, non-menstrual bleeding, heavy bleeding. She has monthly spotting. No dysmen/BTB. She would like another one since this IUD has expired. She is sex active, with same sex partner. No dyspareunia.  She has Q6 months paps with PCP, [facility]. She is due soon for her next pap.   BP 122/80   Pulse 83   Ht 5' (1.524 m)   Wt 142 lb (64.4 kg)   BMI 27.73 kg/m   Pelvic exam:  Two IUD strings present seen coming from the cervical os. EGBUS, vaginal vault and cervix: within normal limits  IUD Removal Strings of IUD identified and grasped.  IUD removed without problem with ring forceps.  Pt tolerated this well.  IUD noted to be intact.   IUD Insertion Procedure Note Patient identified, informed consent performed, consent signed.   Discussed risks of irregular bleeding, cramping, infection, malpositioning or misplacement of the IUD outside the uterus which may require further procedure such as laparoscopy, risk of failure <1%. Time out was performed.    Speculum placed in the vagina.  Cervix visualized.  Cleaned with Betadine x 2.  Grasped anteriorly with a single tooth tenaculum.  Uterus sounded to 9.0 cm.   IUD placed per manufacturer's recommendations.  Strings trimmed to 3 cm. Tenaculum was removed, good hemostasis noted.  Patient tolerated procedure well.   ASSESSMENT:  Encounter for removal and reinsertion of intrauterine contraceptive device (IUD) - Plan: levonorgestrel (MIRENA) 20 MCG/24HR IUD   Meds ordered this encounter  Medications  . levonorgestrel (MIRENA) 20 MCG/24HR IUD     Plan:  Patient was given post-procedure instructions.  She was advised to have backup contraception for one week.   Call if you are having  increasing pain, cramps or bleeding or if you have a fever greater than 100.4 degrees F., shaking chills, nausea or vomiting. Patient was also asked to check IUD strings periodically and follow up in 4 weeks for IUD check.  Return in about 1 month (around 02/21/2018) for IUD f/u.  Destiney Sanabia B. Eriyonna Matsushita, PA-C 01/24/2018 3:51 PM

## 2018-01-24 NOTE — Patient Instructions (Addendum)
I value your feedback and entrusting us with your care. If you get a [facility] patient survey, I would appreciate you taking the time to let us know about your experience today. Thank you!  [facility] [phone]  Instructions after IUD insertion  Most women experience no significant problems after insertion of an IUD, however minor cramping and spotting for a few days is common. Cramps may be treated with ibuprofen 800mg every 8 hours or Tylenol 650 mg every 4 hours. Contact [facility] immediately if you experience any of the following symptoms during the next week: temperature >99.6 degrees, worsening pelvic pain, abdominal pain, fainting, unusually heavy vaginal bleeding, foul vaginal discharge, or if you think you have expelled the IUD.  Nothing inserted in the vagina for 48 hours. You will be scheduled for a follow up visit in approximately four weeks.  You should check monthly to be sure you can feel the IUD strings in the upper vagina. If you are having a monthly period, try to check after each period. If you cannot feel the IUD strings,  contact [facility] immediately so we can do an exam to determine if the IUD has been expelled.   Please use backup protection until we can confirm the IUD is in place.  Call [facility] if you are exposed to or diagnosed with a sexually transmitted infection, as we will need to discuss whether it is safe for you to continue using an IUD.   

## 2018-02-09 NOTE — Telephone Encounter (Signed)
Mirena received & charged 01/24/18

## 2018-02-21 ENCOUNTER — Ambulatory Visit: Payer: Medicaid Other | Admitting: Obstetrics and Gynecology

## 2018-08-23 ENCOUNTER — Emergency Department: Payer: Medicaid Other

## 2018-08-23 ENCOUNTER — Emergency Department
Admission: EM | Admit: 2018-08-23 | Discharge: 2018-08-23 | Disposition: A | Discharge status: Home / Self Care | Payer: Medicaid Other | Attending: Emergency Medicine | Admitting: Emergency Medicine

## 2018-08-23 ENCOUNTER — Other Ambulatory Visit: Payer: Self-pay

## 2018-08-23 DIAGNOSIS — W228XXA Striking against or struck by other objects, initial encounter: Secondary | ICD-10-CM | POA: Diagnosis not present

## 2018-08-23 DIAGNOSIS — Y990 Civilian activity done for income or pay: Secondary | ICD-10-CM | POA: Insufficient documentation

## 2018-08-23 DIAGNOSIS — J45909 Unspecified asthma, uncomplicated: Secondary | ICD-10-CM | POA: Diagnosis not present

## 2018-08-23 DIAGNOSIS — S060X0A Concussion without loss of consciousness, initial encounter: Secondary | ICD-10-CM | POA: Insufficient documentation

## 2018-08-23 DIAGNOSIS — Z87891 Personal history of nicotine dependence: Secondary | ICD-10-CM | POA: Diagnosis not present

## 2018-08-23 DIAGNOSIS — Y9259 Other trade areas as the place of occurrence of the external cause: Secondary | ICD-10-CM | POA: Insufficient documentation

## 2018-08-23 DIAGNOSIS — Y9389 Activity, other specified: Secondary | ICD-10-CM | POA: Diagnosis not present

## 2018-08-23 DIAGNOSIS — Z79899 Other long term (current) drug therapy: Secondary | ICD-10-CM | POA: Insufficient documentation

## 2018-08-23 DIAGNOSIS — R42 Dizziness and giddiness: Secondary | ICD-10-CM | POA: Insufficient documentation

## 2018-08-23 DIAGNOSIS — R55 Syncope and collapse: Secondary | ICD-10-CM | POA: Diagnosis not present

## 2018-08-23 DIAGNOSIS — S0990XA Unspecified injury of head, initial encounter: Secondary | ICD-10-CM | POA: Diagnosis present

## 2018-08-23 LAB — CBC
HCT: 37 % (ref 36.0–46.0)
Hemoglobin: 11.8 g/dL — ABNORMAL LOW (ref 12.0–15.0)
MCH: 24.4 pg — ABNORMAL LOW (ref 26.0–34.0)
MCHC: 31.9 g/dL (ref 30.0–36.0)
MCV: 76.4 fL — ABNORMAL LOW (ref 80.0–100.0)
Platelets: 230 10*3/uL (ref 150–400)
RBC: 4.84 MIL/uL (ref 3.87–5.11)
RDW: 15 % (ref 11.5–15.5)
WBC: 10.6 10*3/uL — ABNORMAL HIGH (ref 4.0–10.5)
nRBC: 0 % (ref 0.0–0.2)

## 2018-08-23 LAB — COMPREHENSIVE METABOLIC PANEL
ALT: 12 U/L (ref 0–44)
AST: 20 U/L (ref 15–41)
Albumin: 4.2 g/dL (ref 3.5–5.0)
Alkaline Phosphatase: 49 U/L (ref 38–126)
Anion gap: 7 (ref 5–15)
BUN: 9 mg/dL (ref 6–20)
CO2: 24 mmol/L (ref 22–32)
Calcium: 8.9 mg/dL (ref 8.9–10.3)
Chloride: 107 mmol/L (ref 98–111)
Creatinine, Ser: 0.93 mg/dL (ref 0.44–1.00)
GFR calc Af Amer: >60 mL/min (ref >60)
GFR calc non Af Amer: >60 mL/min (ref >60)
Glucose, Bld: 101 mg/dL — ABNORMAL HIGH (ref 70–99)
Potassium: 3.6 mmol/L (ref 3.5–5.1)
Sodium: 138 mmol/L (ref 135–145)
Total Bilirubin: 0.3 mg/dL (ref 0.3–1.2)
Total Protein: 7.5 g/dL (ref 6.5–8.1)

## 2018-08-23 LAB — TROPONIN I: Troponin I: <0.03 ng/mL (ref <0.03)

## 2018-08-23 NOTE — ED Notes (Signed)
workmans comp UDS completed.  Pt employer, [employer], sent pt to [facility] urgent care (came with [facility] order form from employer), but per pt, when she went there, they told her since she lost consciousness they could not see her and she was to be seen at the ED.  PER PT.

## 2018-08-23 NOTE — ED Triage Notes (Addendum)
Pt in with co syncope today states did have a head injury at work. States metal cart ran into her hitting her on the forehead, no loc at that time. Pt states she got home today and friend states she had syncopal episode. PT has hx of syncope in the past. Pt was told she might have had a seizure in the past but unsure of dx.

## 2018-08-23 NOTE — ED Notes (Signed)
Pt returned to ED Rm 25 from CT at this time. 

## 2018-08-23 NOTE — ED Notes (Signed)
Patient transported to CT 

## 2018-08-23 NOTE — ED Provider Notes (Addendum)
[facility] Emergency Department Provider Note  ____________________________________________  Time seen: Approximately 10:55 PM  I have reviewed the triage vital signs and the nursing notes.   HISTORY  Chief Complaint Head Injury and Loss of Consciousness    HPI Ashley Fry is a 37 y.o. female with a history of asthma and seizures who comes to the ED complaining of multiple symptoms including headache and dizziness on standing for the past 3 days worse today minor head injury with blunt trauma at work.  At the time of the injury which is at the center of the forehead, no laceration loss of consciousness neck pain or extremity weakness or paresthesias.  However over the last 3 days she is having intermittent symptoms.  Worse standing up, better lying down.  No vision changes confusion or vomiting.  Normal appetite.  No radiating pain      Past Medical History:  Diagnosis Date  . Asthma   . Seizures ([place])      There are no active problems to display for this patient.    Surgical history noncontributory   Prior to Admission medications   Medication Sig Start Date End Date Taking? Authorizing Provider  albuterol (PROVENTIL HFA;VENTOLIN HFA) 108 (90 Base) MCG/ACT inhaler Inhale 2 puffs into the lungs every 6 (six) hours as needed for wheezing or shortness of breath. Patient not taking: Reported on 01/24/2018 12/04/17   Nena Polio, MD  albuterol-ipratropium [facility]) [phone] MCG/ACT inhaler Inhale 2 puffs into the lungs every 6 (six) hours as needed for wheezing or shortness of breath.    [provider]  brompheniramine-pseudoephedrine-DM 30-2-10 MG/5ML syrup Take 5 mLs by mouth 4 (four) times daily as needed. Patient not taking: Reported on 12/04/2017 09/15/16   Sable Feil, PA-C  ibuprofen (ADVIL,MOTRIN) 600 MG tablet Take 1 tablet (600 mg total) by mouth every 8 (eight) hours as needed. Patient not taking: Reported on 12/04/2017 09/15/16    Sable Feil, PA-C  predniSONE (DELTASONE) 20 MG tablet Take 1 tablet (20 mg total) by mouth daily. Patient not taking: Reported on 01/24/2018 12/04/17 12/04/18  Nena Polio, MD     Allergies Patient has no known allergies.   No family history on file.  Social History Social History   Tobacco Use  . Smoking status: Former Smoker    Packs/day: 0.10    Types: Cigarettes  . Smokeless tobacco: Never Used  Substance Use Topics  . Alcohol use: No  . Drug use: No    Review of Systems  Constitutional:   No fever or chills.  ENT:   No sore throat. No rhinorrhea. Cardiovascular:   No chest pain or syncope. Respiratory:   No dyspnea or cough. Gastrointestinal:   Negative for abdominal pain, vomiting and diarrhea.  Musculoskeletal:   Negative for focal pain or swelling All other systems reviewed and are negative except as documented above in ROS and HPI.  ____________________________________________   PHYSICAL EXAM:  VITAL SIGNS: ED Triage Vitals [08/23/18 1908]  Enc Vitals Group     BP 129/87     Pulse Rate 80     Resp 20     Temp 98.5 F (36.9 C)     Temp Source Oral     SpO2 100 %     Weight 145 lb (65.8 kg)     Height 5' (1.524 m)     Head Circumference      Peak Flow      Pain Score  9     Pain Loc      Pain Edu?      Excl. in [place]?     Vital signs reviewed, nursing assessments reviewed.   Constitutional:   Alert and oriented. Non-toxic appearance. Eyes:   Conjunctivae are normal. EOMI. PERRL. ENT      Head:   Normocephalic and atraumatic.      Nose:   No congestion/rhinnorhea.  No epistaxis or septal hematoma      Mouth/Throat:   MMM, no pharyngeal erythema. No peritonsillar mass.       Neck:   No meningismus. Full ROM. Hematological/Lymphatic/Immunilogical:   No cervical lymphadenopathy. Cardiovascular:   RRR. Symmetric bilateral radial and DP pulses.  No murmurs. Cap refill less than 2 seconds. Respiratory:   Normal respiratory effort without  tachypnea/retractions. Breath sounds are clear and equal bilaterally. No wheezes/rales/rhonchi. Gastrointestinal:   Soft and nontender. Non distended. There is no CVA tenderness.  No rebound, rigidity, or guarding. Musculoskeletal:   Normal range of motion in all extremities. No joint effusions.  No lower extremity tenderness.  No edema. Neurologic:   Normal speech and language.  Motor grossly intact. No acute focal neurologic deficits are appreciated.  Skin:    Skin is warm, dry and intact. No rash noted.  No petechiae, purpura, or bullae.  ____________________________________________    LABS (pertinent positives/negatives) (all labs ordered are listed, but only abnormal results are displayed) Labs Reviewed  CBC - Abnormal; Notable for the following components:      Result Value   WBC 10.6 (*)    Hemoglobin 11.8 (*)    MCV 76.4 (*)    MCH 24.4 (*)    All other components within normal limits  COMPREHENSIVE METABOLIC PANEL - Abnormal; Notable for the following components:   Glucose, Bld 101 (*)    All other components within normal limits  TROPONIN I   ____________________________________________   EKG  Interpreted by me Normal sinus rhythm rate of 70, normal axis and intervals.  Normal QRS ST segments and T waves.  ____________________________________________    RADIOLOGY  Ct Head Wo Contrast  Result Date: 08/23/2018 CLINICAL DATA:  Posttraumatic headaches EXAM: CT HEAD WITHOUT CONTRAST TECHNIQUE: Contiguous axial images were obtained from the base of the skull through the vertex without intravenous contrast. COMPARISON:  02/22/2012 FINDINGS: Brain: No evidence of acute infarction, hemorrhage, hydrocephalus, extra-axial collection or mass lesion/mass effect. Vascular: No hyperdense vessel or unexpected calcification. Skull: Normal. Negative for fracture or focal lesion. Sinuses/Orbits: No acute finding. Other: None. IMPRESSION: Normal head CT Electronically Signed   By:  Inez Catalina M.D.   On: 08/23/2018 21:40    ____________________________________________   PROCEDURES Procedures  ____________________________________________  DIFFERENTIAL DIAGNOSIS   Subdural hematoma, concussion  CLINICAL IMPRESSION / ASSESSMENT AND PLAN / ED COURSE  Pertinent labs & imaging results that were available during my care of the patient were reviewed by me and considered in my medical decision making (see chart for details).    Patient presents with neurologic symptoms for the past 3 days ever since head trauma at work.  Neuro exam is nonfocal, vital signs are normal.  CT head obtained to rule out intracranial hemorrhage which was negative.  Patient counseled regarding concussion symptoms and need to avoid potentially dangerous activity and follow-up with primary care.      ____________________________________________   FINAL CLINICAL IMPRESSION(S) / ED DIAGNOSES    Final diagnoses:  Concussion without loss of consciousness, initial encounter  Orthostatic dizziness  ED Discharge Orders    None      Portions of this note were generated with dragon dictation software. Dictation errors may occur despite best attempts at proofreading.    Carrie Mew, MD 08/23/18 2258    Carrie Mew, MD 08/23/18 2259

## 2018-08-23 NOTE — ED Notes (Signed)
Patient to ED from syncope episode when patient went from sitting to standing position. Patient states having head trauma Thursday. Patient states had no LOC when hit in head. Patient states had nose bleed from hit to head with tenderness. No change in appetite or nausea or vomiting. Patient states having a headache off and on the past few days.

## 2018-08-23 NOTE — Discharge Instructions (Addendum)
Your CT scan of the head and labs today were okay.  Avoid any potentially dangerous activity or strenuous activity until your symptoms have completely resolved.  Follow-up with your primary care doctor to continue monitoring your symptoms.

## 2018-09-13 ENCOUNTER — Encounter: Payer: Self-pay | Admitting: Emergency Medicine

## 2018-09-13 ENCOUNTER — Emergency Department: Payer: Medicaid Other

## 2018-09-13 ENCOUNTER — Emergency Department
Admission: EM | Admit: 2018-09-13 | Discharge: 2018-09-13 | Disposition: A | Discharge status: Home / Self Care | Payer: Medicaid Other | Attending: Student in an Organized Health Care Education/Training Program | Admitting: Student in an Organized Health Care Education/Training Program

## 2018-09-13 ENCOUNTER — Other Ambulatory Visit: Payer: Self-pay

## 2018-09-13 DIAGNOSIS — F17210 Nicotine dependence, cigarettes, uncomplicated: Secondary | ICD-10-CM | POA: Insufficient documentation

## 2018-09-13 DIAGNOSIS — J40 Bronchitis, not specified as acute or chronic: Secondary | ICD-10-CM | POA: Insufficient documentation

## 2018-09-13 DIAGNOSIS — R0602 Shortness of breath: Secondary | ICD-10-CM | POA: Insufficient documentation

## 2018-09-13 DIAGNOSIS — R079 Chest pain, unspecified: Secondary | ICD-10-CM

## 2018-09-13 DIAGNOSIS — R05 Cough: Secondary | ICD-10-CM | POA: Diagnosis not present

## 2018-09-13 LAB — CBC
HCT: 35 % — ABNORMAL LOW (ref 36.0–46.0)
Hemoglobin: 11.1 g/dL — ABNORMAL LOW (ref 12.0–15.0)
MCH: 24.2 pg — ABNORMAL LOW (ref 26.0–34.0)
MCHC: 31.7 g/dL (ref 30.0–36.0)
MCV: 76.4 fL — ABNORMAL LOW (ref 80.0–100.0)
Platelets: 230 10*3/uL (ref 150–400)
RBC: 4.58 MIL/uL (ref 3.87–5.11)
RDW: 15.2 % (ref 11.5–15.5)
WBC: 8 10*3/uL (ref 4.0–10.5)
nRBC: 0 % (ref 0.0–0.2)

## 2018-09-13 LAB — BASIC METABOLIC PANEL
Anion gap: 7 (ref 5–15)
BUN: 16 mg/dL (ref 6–20)
CO2: 23 mmol/L (ref 22–32)
Calcium: 9 mg/dL (ref 8.9–10.3)
Chloride: 106 mmol/L (ref 98–111)
Creatinine, Ser: 0.8 mg/dL (ref 0.44–1.00)
GFR calc Af Amer: >60 mL/min (ref >60)
GFR calc non Af Amer: >60 mL/min (ref >60)
Glucose, Bld: 97 mg/dL (ref 70–99)
Potassium: 3.9 mmol/L (ref 3.5–5.1)
Sodium: 136 mmol/L (ref 135–145)

## 2018-09-13 LAB — TROPONIN I: Troponin I: <0.03 ng/mL (ref <0.03)

## 2018-09-13 MED ORDER — LORAZEPAM 1 MG PO TABS
1.0000 mg | ORAL_TABLET | Freq: Once | ORAL | Status: AC
  Administered 2018-09-13: 1 mg via ORAL
  Filled 2018-09-13: qty 1

## 2018-09-13 MED ORDER — PREDNISONE 20 MG PO TABS
40.0000 mg | ORAL_TABLET | Freq: Every day | ORAL | 0 refills | Status: AC
Start: 2018-09-13 — End: 2018-09-18

## 2018-09-13 MED ORDER — PREDNISONE 20 MG PO TABS
60.0000 mg | ORAL_TABLET | Freq: Once | ORAL | Status: AC
Start: 2018-09-13 — End: 2018-09-13
  Administered 2018-09-13: 60 mg via ORAL
  Filled 2018-09-13: qty 3

## 2018-09-13 MED ORDER — IPRATROPIUM-ALBUTEROL 0.5-2.5 (3) MG/3ML IN SOLN
3.0000 mL | Freq: Once | RESPIRATORY_TRACT | Status: AC
Start: 2018-09-13 — End: 2018-09-13
  Administered 2018-09-13: 3 mL via RESPIRATORY_TRACT
  Filled 2018-09-13: qty 3

## 2018-09-13 MED ORDER — ALBUTEROL SULFATE HFA 108 (90 BASE) MCG/ACT IN AERS: 2.0000 | INHALATION_SPRAY | Freq: Four times a day (QID) | RESPIRATORY_TRACT | 2 refills | Status: DC | PRN

## 2018-09-13 MED ORDER — DOXYCYCLINE HYCLATE 100 MG PO TABS
100.0000 mg | ORAL_TABLET | Freq: Once | ORAL | Status: AC
  Administered 2018-09-13: 100 mg via ORAL
  Filled 2018-09-13: qty 1

## 2018-09-13 MED ORDER — DOXYCYCLINE HYCLATE 50 MG PO CAPS: 100.0000 mg | ORAL_CAPSULE | Freq: Two times a day (BID) | ORAL | 0 refills | Status: AC

## 2018-09-13 NOTE — ED Notes (Signed)
First Nurse Note: Patient in radiology.

## 2018-09-13 NOTE — ED Provider Notes (Signed)
[facility] Emergency Department Provider Note    First MD Initiated Contact with Patient 09/13/18 [phone]     (approximate)  I have reviewed the triage vital signs and the nursing notes.   HISTORY  Chief Complaint Chest Pain    HPI Ashley Fry is a 37 y.o. female ported history of asthma presents the ER with chest discomfort and shortness of breath for the past 4 days.  Does endorse worsening exertional dyspnea and reproducible chest discomfort.  States that she is been having a cough coughing up green sputum.  No measured fevers or chills.  Does smoke half a pack of cigarettes daily has been doing that since she was 37 years old.  She is not on any birth control.  No recent immobilization.  No recent prolonged travel.  No history of blood clots.  No history of hypertension, diabetes or high cholesterol.  No history of CAD that she is aware of.  Says that she has had similar symptoms in the past and was given steroids and inhalers with improvement in symptoms but it recurs several months later.    Past Medical History:  Diagnosis Date  . Asthma   . Seizures ([place])    No family history on file. History reviewed. No pertinent surgical history. There are no active problems to display for this patient.     Prior to Admission medications   Medication Sig Start Date End Date Taking? Authorizing Provider  albuterol (PROVENTIL HFA;VENTOLIN HFA) 108 (90 Base) MCG/ACT inhaler Inhale 2 puffs into the lungs every 6 (six) hours as needed for wheezing or shortness of breath. Patient not taking: Reported on 01/24/2018 12/04/17   Nena Polio, MD  albuterol (PROVENTIL HFA;VENTOLIN HFA) 108 (90 Base) MCG/ACT inhaler Inhale 2 puffs into the lungs every 6 (six) hours as needed for wheezing or shortness of breath. 09/13/18   Merlyn Lot, MD  albuterol-ipratropium (COMBIVENT) [phone] MCG/ACT inhaler Inhale 2 puffs into the lungs every 6 (six) hours as needed for  wheezing or shortness of breath.    [provider]  brompheniramine-pseudoephedrine-DM 30-2-10 MG/5ML syrup Take 5 mLs by mouth 4 (four) times daily as needed. Patient not taking: Reported on 12/04/2017 09/15/16   Sable Feil, PA-C  doxycycline (VIBRAMYCIN) 50 MG capsule Take 2 capsules (100 mg total) by mouth 2 (two) times daily for 7 days. 09/13/18 09/20/18  Merlyn Lot, MD  ibuprofen (ADVIL,MOTRIN) 600 MG tablet Take 1 tablet (600 mg total) by mouth every 8 (eight) hours as needed. Patient not taking: Reported on 12/04/2017 09/15/16   Sable Feil, PA-C  predniSONE (DELTASONE) 20 MG tablet Take 1 tablet (20 mg total) by mouth daily. Patient not taking: Reported on 01/24/2018 12/04/17 12/04/18  Nena Polio, MD  predniSONE (DELTASONE) 20 MG tablet Take 2 tablets (40 mg total) by mouth daily for 5 days. 09/13/18 09/18/18  Merlyn Lot, MD    Allergies Patient has no known allergies.    Social History Social History   Tobacco Use  . Smoking status: Former Smoker    Packs/day: 0.50    Types: Cigarettes  . Smokeless tobacco: Never Used  Substance Use Topics  . Alcohol use: No  . Drug use: No    Review of Systems Patient denies headaches, rhinorrhea, blurry vision, numbness, shortness of breath, chest pain, edema, cough, abdominal pain, nausea, vomiting, diarrhea, dysuria, fevers, rashes or hallucinations unless otherwise stated above in HPI. ____________________________________________   PHYSICAL EXAM:  VITAL SIGNS:  Vitals:   09/13/18 0910 09/13/18 0912  BP:    Pulse:    Resp:    Temp:    SpO2: 100% 100%    Constitutional: Alert and oriented.  Eyes: Conjunctivae are normal.  Head: Atraumatic. Nose: No congestion/rhinnorhea. Mouth/Throat: Mucous membranes are moist.   Neck: No stridor. Painless ROM.  Cardiovascular: Normal rate, regular rhythm. Grossly normal heart sounds.  Good peripheral circulation. Respiratory: Normal respiratory effort.  No  retractions. Lungs with end expiratory wheeze noted throughout Gastrointestinal: Soft and nontender. No distention. No abdominal bruits. No CVA tenderness. Genitourinary:  Musculoskeletal: No lower extremity tenderness nor edema.  No joint effusions. Neurologic:  Normal speech and language. No gross focal neurologic deficits are appreciated. No facial droop Skin:  Skin is warm, dry and intact. No rash noted. Psychiatric: Mood and affect are  anxious. Speech and behavior are normal.  ____________________________________________   LABS (all labs ordered are listed, but only abnormal results are displayed)  Results for orders placed or performed during the hospital encounter of 09/13/18 (from the past 24 hour(s))  Basic metabolic panel     Status: None   Collection Time: 09/13/18  7:19 AM  Result Value Ref Range   Sodium 136 135 - 145 mmol/L   Potassium 3.9 3.5 - 5.1 mmol/L   Chloride 106 98 - 111 mmol/L   CO2 23 22 - 32 mmol/L   Glucose, Bld 97 70 - 99 mg/dL   BUN 16 6 - 20 mg/dL   Creatinine, Ser 0.80 0.44 - 1.00 mg/dL   Calcium 9.0 8.9 - 10.3 mg/dL   GFR calc non Af Amer >60 >60 mL/min   GFR calc Af Amer >60 >60 mL/min   Anion gap 7 5 - 15  CBC     Status: Abnormal   Collection Time: 09/13/18  7:19 AM  Result Value Ref Range   WBC 8.0 4.0 - 10.5 K/uL   RBC 4.58 3.87 - 5.11 MIL/uL   Hemoglobin 11.1 (L) 12.0 - 15.0 g/dL   HCT 35.0 (L) 36.0 - 46.0 %   MCV 76.4 (L) 80.0 - 100.0 fL   MCH 24.2 (L) 26.0 - 34.0 pg   MCHC 31.7 30.0 - 36.0 g/dL   RDW 15.2 11.5 - 15.5 %   Platelets 230 150 - 400 K/uL   nRBC 0.0 0.0 - 0.2 %  Troponin I - ONCE - STAT     Status: None   Collection Time: 09/13/18  7:19 AM  Result Value Ref Range   Troponin I <0.03 <0.03 ng/mL   ____________________________________________  EKG My review and personal interpretation at Time: 7:07   Indication: sob  Rate: 65  Rhythm: sinus Axis: normal Other: normal intervals, no  stemi ____________________________________________  RADIOLOGY  I personally reviewed all radiographic images ordered to evaluate for the above acute complaints and reviewed radiology reports and findings.  These findings were personally discussed with the patient.  Please see medical record for radiology report.  ____________________________________________   PROCEDURES  Procedure(s) performed:  Procedures    Critical Care performed: no ____________________________________________   INITIAL IMPRESSION / ASSESSMENT AND PLAN / ED COURSE  Pertinent labs & imaging results that were available during my care of the patient were reviewed by me and considered in my medical decision making (see chart for details).   DDX: Asthma, copd, CHF, pna, ptx, malignancy, Pe, anemia   Alnisa D Haydon is a 37 y.o. who presents to the ED with symptoms as described above.  Patient nontoxic-appearing.  Not consistent with ACS.  Patient low risk by Wells criteria.  PERC negative.  Not consistent with PE.  Seems very bronchitic in nature given the wheeze.  Patient also under a lot of stress as her father is in the ICU at [facility].  Will give nebulizers and patient is describing productive cough therefore will treat for community-acquired pneumonia acute bronchitis.  ----------------------------------------- 10:45 AM on 09/13/2018 -----------------------------------------  Patient with improvement in symptoms but states that she needs to leave due to family emergency.  At this point do believe she is clinically stable for discharge.  Will be discharged with these medications.  Encouraged patient to return immediately should her symptoms return or worsen.      As part of my medical decision making, I reviewed the following data within the [place] notes reviewed and incorporated, Labs reviewed, notes from prior ED visits.____________________________________________   FINAL  CLINICAL IMPRESSION(S) / ED DIAGNOSES  Final diagnoses:  Chest pain, unspecified type  Bronchitis      NEW MEDICATIONS STARTED DURING THIS VISIT:  New Prescriptions   ALBUTEROL (PROVENTIL HFA;VENTOLIN HFA) 108 (90 BASE) MCG/ACT INHALER    Inhale 2 puffs into the lungs every 6 (six) hours as needed for wheezing or shortness of breath.   DOXYCYCLINE (VIBRAMYCIN) 50 MG CAPSULE    Take 2 capsules (100 mg total) by mouth 2 (two) times daily for 7 days.   PREDNISONE (DELTASONE) 20 MG TABLET    Take 2 tablets (40 mg total) by mouth daily for 5 days.     Note:  This document was prepared using Dragon voice recognition software and may include unintentional dictation errors.    Merlyn Lot, MD 09/13/18 1046

## 2018-09-13 NOTE — ED Triage Notes (Signed)
Patient reports aching chest pain x4 days. States she has been under a lot of stress lately due to her father's serious illness and that she recently started smoking again. States she thinks the pain is from smoking. Reports SOB but denies N/V.

## 2018-09-13 NOTE — ED Notes (Signed)
AAOx3.  Skin warm and dry. NAD.  Ambulates with easy and steady gait.   

## 2018-09-13 NOTE — ED Notes (Signed)
Patient ambulatory to Rm 14, Helene Kelp RN aware of placement.

## 2018-11-21 ENCOUNTER — Ambulatory Visit (INDEPENDENT_AMBULATORY_CARE_PROVIDER_SITE_OTHER): Payer: Medicaid Other | Admitting: Internal Medicine

## 2018-11-21 ENCOUNTER — Encounter: Payer: Self-pay | Admitting: Internal Medicine

## 2018-11-21 VITALS — BP 120/80 | HR 86 | Ht 60.0 in | Wt 150.4 lb

## 2018-11-21 DIAGNOSIS — R0602 Shortness of breath: Secondary | ICD-10-CM

## 2018-11-21 DIAGNOSIS — J4550 Severe persistent asthma, uncomplicated: Secondary | ICD-10-CM | POA: Diagnosis not present

## 2018-11-21 MED ORDER — BUDESONIDE-FORMOTEROL FUMARATE 160-4.5 MCG/ACT IN AERO: 2.0000 | INHALATION_SPRAY | Freq: Two times a day (BID) | RESPIRATORY_TRACT | 12 refills | Status: DC

## 2018-11-21 MED ORDER — NICOTINE 21 MG/24HR TD PT24: 21.0000 mg | MEDICATED_PATCH | TRANSDERMAL | 1 refills | Status: DC

## 2018-11-21 MED ORDER — BUDESONIDE-FORMOTEROL FUMARATE 160-4.5 MCG/ACT IN AERO: 2.0000 | INHALATION_SPRAY | Freq: Two times a day (BID) | RESPIRATORY_TRACT | 0 refills | Status: AC

## 2018-11-21 NOTE — Progress Notes (Signed)
Name: Ashley Fry MRN: [id number] DOB: 28-Oct-1980     CONSULTATION DATE: 11/21/2018   CHIEF COMPLAINT: SOB  HISTORY OF PRESENT ILLNESS:  38 yo AAF with h/o ASTHMA also h/o Smoking  11/2017 ASTHMA exacerbation active smoking-responded to prednisone 08/2018 ASTHMA exacerbation active smoking-responded to prednisone 10/2018 ASTHMA exacerbation active smoking-responded to prednisone  She just completed course of prednisone She is only on albuterol inhaler as needed  She still smokes 1/2 PPD for 20 years She has been having persistent symptoms of ASTHMA for last 1 years  Dx with ASTHMA 10 years ago Has recurrent URI's in the past  Has a dog Has never been tested for allergies  She has constant wheezing Chronic cough Chronic Sinus congestion  She uses albuterol inhaler as needed and it helps    Smoking Assessment and Cessation Counseling   Upon further questioning, Patient smokes 1.2 PPD I have advised patient to quit/stop smoking as soon as possible due to high risk for multiple medical problems  Patient is willing to quit smoking   I have advised patient that we can assist and have options of Nicotine replacement therapy. I also advised patient on behavioral therapy and can provide oral medication therapy in conjunction with the other therapies  Follow up next Office visit  for assessment of smoking cessation  Smoking cessation counseling advised for 4 minutes     PAST MEDICAL HISTORY :   has a past medical history of Asthma and Seizures ([place]).  has no past surgical history on file. s/p IUD placement and removal Prior to Admission medications   Medication Sig Start Date End Date Taking? Authorizing Provider  albuterol (PROVENTIL HFA;VENTOLIN HFA) 108 (90 Base) MCG/ACT inhaler Inhale 2 puffs into the lungs every 6 (six) hours as needed for wheezing or shortness of breath. Patient not taking: Reported on 01/24/2018 12/04/17   Nena Polio, MD  albuterol  (PROVENTIL HFA;VENTOLIN HFA) 108 (90 Base) MCG/ACT inhaler Inhale 2 puffs into the lungs every 6 (six) hours as needed for wheezing or shortness of breath. 09/13/18   Merlyn Lot, MD  albuterol-ipratropium (COMBIVENT) [phone] MCG/ACT inhaler Inhale 2 puffs into the lungs every 6 (six) hours as needed for wheezing or shortness of breath.    [provider]  brompheniramine-pseudoephedrine-DM 30-2-10 MG/5ML syrup Take 5 mLs by mouth 4 (four) times daily as needed. Patient not taking: Reported on 12/04/2017 09/15/16   Sable Feil, PA-C  ibuprofen (ADVIL,MOTRIN) 600 MG tablet Take 1 tablet (600 mg total) by mouth every 8 (eight) hours as needed. Patient not taking: Reported on 12/04/2017 09/15/16   Sable Feil, PA-C  predniSONE (DELTASONE) 20 MG tablet Take 1 tablet (20 mg total) by mouth daily. Patient not taking: Reported on 01/24/2018 12/04/17 12/04/18  Nena Polio, MD   No Known Allergies  FAMILY HISTORY:  +COPD  SOCIAL HISTORY:  reports that she has quit smoking. Her smoking use included cigarettes. She smoked 0.50 packs per day. She has never used smokeless tobacco. She reports that she does not drink alcohol or use drugs.  No vaping  REVIEW OF SYSTEMS:   Constitutional: Negative for fever, chills, weight loss, malaise/fatigue and diaphoresis.  HENT: Negative for hearing loss, ear pain, nosebleeds, congestion, sore throat, neck pain, tinnitus and ear discharge.   Eyes: Negative for blurred vision, double vision, photophobia, pain, discharge and redness.  Respiratory: +cough, -hemoptysis, -sputum production, +shortness of breath, +wheezing and stridor.   Cardiovascular: Negative for chest pain, palpitations, orthopnea,  claudication, leg swelling and PND.  Gastrointestinal: Negative for heartburn, nausea, vomiting, abdominal pain, diarrhea, constipation, blood in stool and melena.  Genitourinary: Negative for dysuria, urgency, frequency, hematuria and flank pain.    Musculoskeletal: Negative for myalgias, back pain, joint pain and falls.  Skin: Negative for itching and rash.  Neurological: Negative for dizziness, tingling, tremors, sensory change, speech change, focal weakness, seizures, loss of consciousness, weakness and headaches.  Endo/Heme/Allergies: Negative for environmental allergies and polydipsia. Does not bruise/bleed easily.  ALL OTHER ROS ARE NEGATIVE   BP 120/80   Pulse 86   Ht 5' (1.524 m)   Wt 150 lb 6.4 oz (68.2 kg)   SpO2 98%   BMI 29.37 kg/m    Physical Examination:   GENERAL:NAD, no fevers, chills, no weakness no fatigue HEAD: Normocephalic, atraumatic.  EYES: Pupils equal, round, reactive to light. Extraocular muscles intact. No scleral icterus.  MOUTH: Moist mucosal membrane.   EAR, NOSE, THROAT: Clear without exudates. No external lesions.  NECK: Supple. No thyromegaly. No nodules. No JVD.  PULMONARY:CTA B/L no wheezes, no crackles, no rhonchi CARDIOVASCULAR: S1 and S2. Regular rate and rhythm. No murmurs, rubs, or gallops. No edema.  GASTROINTESTINAL: Soft, nontender, nondistended. No masses. Positive bowel sounds.  MUSCULOSKELETAL: No swelling, clubbing, or edema. Range of motion full in all extremities.  NEUROLOGIC: Cranial nerves II through XII are intact. No gross focal neurological deficits.  SKIN: No ulceration, lesions, rashes, or cyanosis. Skin warm and dry. Turgor intact.  PSYCHIATRIC: Mood, affect within normal limits. The patient is awake, alert and oriented x 3. Insight, judgment intact.        IMAGING   I have Independently reviewed images of  CXR 09/13/18 Interpretation: no pneumonia No effusions NL CXR    ASSESSMENT AND PLAN SYNOPSIS  Severe persistent ASTHMA with active smoking Start Symbicort 160 BID as prescribed-education in office provided Albuterol as needed smoking cessation strongly advised Patient needs ENT referral for assessment for VCD and also Skin testing for  allergies No abx at this time No steroids at this time Start Nicotine patches   Follow up in 3 months  Tamika Shropshire Patricia Pesa, M.D.  [facility]  Medical Director [place] Director [place] Department

## 2018-11-21 NOTE — Patient Instructions (Signed)
PLEASE STOP SMOKING!!!  START NICOTINE PATCHES  START SYMBICORT INHALERS AS PRESCIBED  OBTAIN PFT's  SKIN TESTING FOR ALLERGIES and VCD ENT referral  ALBUTEROL AS NEEDED 2-4 puffs every 4 hrs

## 2019-02-12 ENCOUNTER — Other Ambulatory Visit: Payer: Self-pay

## 2019-02-12 DIAGNOSIS — F1020 Alcohol dependence, uncomplicated: Secondary | ICD-10-CM | POA: Insufficient documentation

## 2019-02-12 DIAGNOSIS — R569 Unspecified convulsions: Secondary | ICD-10-CM | POA: Insufficient documentation

## 2019-02-12 DIAGNOSIS — J45909 Unspecified asthma, uncomplicated: Secondary | ICD-10-CM | POA: Diagnosis not present

## 2019-02-12 DIAGNOSIS — Z79899 Other long term (current) drug therapy: Secondary | ICD-10-CM | POA: Diagnosis not present

## 2019-02-12 DIAGNOSIS — F1410 Cocaine abuse, uncomplicated: Secondary | ICD-10-CM | POA: Insufficient documentation

## 2019-02-12 DIAGNOSIS — Z1159 Encounter for screening for other viral diseases: Secondary | ICD-10-CM | POA: Insufficient documentation

## 2019-02-12 DIAGNOSIS — Z9119 Patient's noncompliance with other medical treatment and regimen: Secondary | ICD-10-CM | POA: Diagnosis not present

## 2019-02-12 DIAGNOSIS — R45851 Suicidal ideations: Secondary | ICD-10-CM | POA: Diagnosis not present

## 2019-02-12 DIAGNOSIS — F17210 Nicotine dependence, cigarettes, uncomplicated: Secondary | ICD-10-CM | POA: Insufficient documentation

## 2019-02-12 DIAGNOSIS — F3163 Bipolar disorder, current episode mixed, severe, without psychotic features: Secondary | ICD-10-CM | POA: Insufficient documentation

## 2019-02-12 DIAGNOSIS — Z046 Encounter for general psychiatric examination, requested by authority: Secondary | ICD-10-CM | POA: Diagnosis present

## 2019-02-13 ENCOUNTER — Emergency Department
Admission: EM | Admit: 2019-02-13 | Discharge: 2019-02-13 | Disposition: A | Discharge status: Other Acute Inpatient Hospital | Payer: Medicaid Other | Attending: Student in an Organized Health Care Education/Training Program | Admitting: Student in an Organized Health Care Education/Training Program

## 2019-02-13 ENCOUNTER — Inpatient Hospital Stay
Admission: AD | Admit: 2019-02-13 | Discharge: 2019-02-15 | DRG: 897 | Disposition: A | Discharge status: Home / Self Care | Payer: Medicaid Other | Attending: Psychiatry | Admitting: Psychiatry

## 2019-02-13 ENCOUNTER — Encounter: Payer: Self-pay | Admitting: Emergency Medicine

## 2019-02-13 ENCOUNTER — Other Ambulatory Visit: Payer: Self-pay

## 2019-02-13 DIAGNOSIS — G479 Sleep disorder, unspecified: Secondary | ICD-10-CM | POA: Diagnosis present

## 2019-02-13 DIAGNOSIS — R45851 Suicidal ideations: Secondary | ICD-10-CM | POA: Diagnosis present

## 2019-02-13 DIAGNOSIS — F1410 Cocaine abuse, uncomplicated: Secondary | ICD-10-CM | POA: Diagnosis not present

## 2019-02-13 DIAGNOSIS — F3163 Bipolar disorder, current episode mixed, severe, without psychotic features: Secondary | ICD-10-CM | POA: Diagnosis present

## 2019-02-13 DIAGNOSIS — F17210 Nicotine dependence, cigarettes, uncomplicated: Secondary | ICD-10-CM | POA: Diagnosis present

## 2019-02-13 DIAGNOSIS — F1994 Other psychoactive substance use, unspecified with psychoactive substance-induced mood disorder: Secondary | ICD-10-CM | POA: Diagnosis not present

## 2019-02-13 DIAGNOSIS — F1010 Alcohol abuse, uncomplicated: Secondary | ICD-10-CM

## 2019-02-13 DIAGNOSIS — R569 Unspecified convulsions: Secondary | ICD-10-CM

## 2019-02-13 DIAGNOSIS — F1414 Cocaine abuse with cocaine-induced mood disorder: Principal | ICD-10-CM | POA: Diagnosis present

## 2019-02-13 DIAGNOSIS — F1910 Other psychoactive substance abuse, uncomplicated: Secondary | ICD-10-CM

## 2019-02-13 HISTORY — DX: Bipolar disorder, unspecified: F31.9

## 2019-02-13 HISTORY — DX: Alcohol abuse, uncomplicated: F10.10

## 2019-02-13 HISTORY — DX: Depression, unspecified: F32.A

## 2019-02-13 HISTORY — DX: Other psychoactive substance abuse, uncomplicated: F19.10

## 2019-02-13 LAB — COMPREHENSIVE METABOLIC PANEL
ALT: 16 U/L (ref 0–44)
AST: 20 U/L (ref 15–41)
Albumin: 4.4 g/dL (ref 3.5–5.0)
Alkaline Phosphatase: 51 U/L (ref 38–126)
Anion gap: 8 (ref 5–15)
BUN: 11 mg/dL (ref 6–20)
CO2: 23 mmol/L (ref 22–32)
Calcium: 8.9 mg/dL (ref 8.9–10.3)
Chloride: 110 mmol/L (ref 98–111)
Creatinine, Ser: 0.92 mg/dL (ref 0.44–1.00)
GFR calc Af Amer: >60 mL/min (ref >60)
GFR calc non Af Amer: >60 mL/min (ref >60)
Glucose, Bld: 107 mg/dL — ABNORMAL HIGH (ref 70–99)
Potassium: 3.9 mmol/L (ref 3.5–5.1)
Sodium: 141 mmol/L (ref 135–145)
Total Bilirubin: 0.5 mg/dL (ref 0.3–1.2)
Total Protein: 7.7 g/dL (ref 6.5–8.1)

## 2019-02-13 LAB — URINE DRUG SCREEN, QUALITATIVE (ARMC ONLY)
Amphetamines, Ur Screen: NOT DETECTED
Barbiturates, Ur Screen: NOT DETECTED
Benzodiazepine, Ur Scrn: NOT DETECTED
Cannabinoid 50 Ng, Ur ~~LOC~~: NOT DETECTED
Cocaine Metabolite,Ur ~~LOC~~: POSITIVE — AB
MDMA (Ecstasy)Ur Screen: NOT DETECTED
Methadone Scn, Ur: NOT DETECTED
Opiate, Ur Screen: NOT DETECTED
Phencyclidine (PCP) Ur S: NOT DETECTED
Tricyclic, Ur Screen: NOT DETECTED

## 2019-02-13 LAB — CBC
HCT: 36.2 % (ref 36.0–46.0)
Hemoglobin: 11.9 g/dL — ABNORMAL LOW (ref 12.0–15.0)
MCH: 24.5 pg — ABNORMAL LOW (ref 26.0–34.0)
MCHC: 32.9 g/dL (ref 30.0–36.0)
MCV: 74.6 fL — ABNORMAL LOW (ref 80.0–100.0)
Platelets: 297 10*3/uL (ref 150–400)
RBC: 4.85 MIL/uL (ref 3.87–5.11)
RDW: 14.6 % (ref 11.5–15.5)
WBC: 7.4 10*3/uL (ref 4.0–10.5)
nRBC: 0 % (ref 0.0–0.2)

## 2019-02-13 LAB — ACETAMINOPHEN LEVEL: Acetaminophen (Tylenol), Serum: <10 ug/mL — ABNORMAL LOW (ref 10–30)

## 2019-02-13 LAB — LIPID PANEL
Cholesterol: 183 mg/dL (ref 0–200)
HDL: 67 mg/dL (ref >40)
LDL Cholesterol: 98 mg/dL (ref 0–99)
Total CHOL/HDL Ratio: 2.7 RATIO
Triglycerides: 89 mg/dL (ref <150)
VLDL: 18 mg/dL (ref 0–40)

## 2019-02-13 LAB — TSH: TSH: 1.358 u[IU]/mL (ref 0.350–4.500)

## 2019-02-13 LAB — SALICYLATE LEVEL: Salicylate Lvl: <7 mg/dL (ref 2.8–30.0)

## 2019-02-13 LAB — HEMOGLOBIN A1C
Hgb A1c MFr Bld: 6 % — ABNORMAL HIGH (ref 4.8–5.6)
Mean Plasma Glucose: 125.5 mg/dL

## 2019-02-13 LAB — SARS CORONAVIRUS 2 BY RT PCR (HOSPITAL ORDER, PERFORMED IN ~~LOC~~ HOSPITAL LAB): SARS Coronavirus 2: NEGATIVE

## 2019-02-13 LAB — ETHANOL: Alcohol, Ethyl (B): 142 mg/dL — ABNORMAL HIGH (ref <10)

## 2019-02-13 MED ORDER — PALIPERIDONE PALMITATE ER 156 MG/ML IM SUSY: 156.0000 mg | PREFILLED_SYRINGE | INTRAMUSCULAR | Status: DC

## 2019-02-13 MED ORDER — LORAZEPAM 1 MG PO TABS
1.0000 mg | ORAL_TABLET | ORAL | Status: AC | PRN
  Administered 2019-02-13: 21:00:00 1 mg via ORAL
  Filled 2019-02-13: qty 1

## 2019-02-13 MED ORDER — LORAZEPAM 2 MG/ML IJ SOLN: 0.0000 mg | Freq: Four times a day (QID) | INTRAMUSCULAR | Status: DC

## 2019-02-13 MED ORDER — VITAMIN B-1 100 MG PO TABS
100.0000 mg | ORAL_TABLET | Freq: Every day | ORAL | Status: DC
  Administered 2019-02-14: 100 mg via ORAL
  Filled 2019-02-13: qty 1

## 2019-02-13 MED ORDER — PALIPERIDONE ER 3 MG PO TB24: 3.0000 mg | ORAL_TABLET | Freq: Every day | ORAL | Status: DC

## 2019-02-13 MED ORDER — DIVALPROEX SODIUM 250 MG PO DR TAB
250.0000 mg | DELAYED_RELEASE_TABLET | Freq: Two times a day (BID) | ORAL | Status: DC
  Administered 2019-02-13 – 2019-02-15 (×4): 250 mg via ORAL
  Filled 2019-02-13 (×4): qty 1

## 2019-02-13 MED ORDER — MOMETASONE FURO-FORMOTEROL FUM 200-5 MCG/ACT IN AERO
2.0000 | INHALATION_SPRAY | Freq: Two times a day (BID) | RESPIRATORY_TRACT | Status: DC
  Administered 2019-02-13 – 2019-02-14 (×2): 2 via RESPIRATORY_TRACT
  Filled 2019-02-13: qty 8.8

## 2019-02-13 MED ORDER — THIAMINE HCL 100 MG/ML IJ SOLN: 100.0000 mg | Freq: Every day | INTRAMUSCULAR | Status: DC

## 2019-02-13 MED ORDER — PALIPERIDONE PALMITATE ER 234 MG/1.5ML IM SUSY
234.0000 mg | PREFILLED_SYRINGE | Freq: Once | INTRAMUSCULAR | Status: DC
  Filled 2019-02-13: qty 1.5

## 2019-02-13 MED ORDER — ACETAMINOPHEN 325 MG PO TABS: 650.0000 mg | ORAL_TABLET | Freq: Four times a day (QID) | ORAL | Status: DC | PRN

## 2019-02-13 MED ORDER — ALBUTEROL SULFATE HFA 108 (90 BASE) MCG/ACT IN AERS
2.0000 | INHALATION_SPRAY | Freq: Four times a day (QID) | RESPIRATORY_TRACT | Status: DC | PRN
  Filled 2019-02-13: qty 6.7

## 2019-02-13 MED ORDER — NALTREXONE HCL 50 MG PO TABS
50.0000 mg | ORAL_TABLET | Freq: Every day | ORAL | Status: DC
  Filled 2019-02-13: qty 1

## 2019-02-13 MED ORDER — ACETAMINOPHEN 325 MG PO TABS
650.0000 mg | ORAL_TABLET | Freq: Four times a day (QID) | ORAL | Status: DC | PRN
  Administered 2019-02-13: 650 mg via ORAL
  Filled 2019-02-13: qty 2

## 2019-02-13 MED ORDER — PALIPERIDONE PALMITATE ER 234 MG/1.5ML IM SUSY
234.0000 mg | PREFILLED_SYRINGE | Freq: Once | INTRAMUSCULAR | Status: AC
  Administered 2019-02-13: 234 mg via INTRAMUSCULAR
  Filled 2019-02-13 (×2): qty 1.5

## 2019-02-13 MED ORDER — PALIPERIDONE ER 3 MG PO TB24
3.0000 mg | ORAL_TABLET | Freq: Every day | ORAL | Status: AC
  Administered 2019-02-13: 3 mg via ORAL
  Filled 2019-02-13: qty 1

## 2019-02-13 MED ORDER — ALUM & MAG HYDROXIDE-SIMETH 200-200-20 MG/5ML PO SUSP: 30.0000 mL | ORAL | Status: DC | PRN

## 2019-02-13 MED ORDER — LORAZEPAM 2 MG/ML IJ SOLN: 0.0000 mg | Freq: Two times a day (BID) | INTRAMUSCULAR | Status: DC

## 2019-02-13 MED ORDER — ZIPRASIDONE MESYLATE 20 MG IM SOLR: 20.0000 mg | INTRAMUSCULAR | Status: DC | PRN

## 2019-02-13 MED ORDER — MAGNESIUM HYDROXIDE 400 MG/5ML PO SUSP: 30.0000 mL | Freq: Every day | ORAL | Status: DC | PRN

## 2019-02-13 MED ORDER — LORAZEPAM 2 MG PO TABS
0.0000 mg | ORAL_TABLET | Freq: Four times a day (QID) | ORAL | Status: DC
  Administered 2019-02-14: 2 mg via ORAL
  Filled 2019-02-13: qty 1

## 2019-02-13 MED ORDER — LORAZEPAM 2 MG PO TABS: 0.0000 mg | ORAL_TABLET | Freq: Two times a day (BID) | ORAL | Status: DC

## 2019-02-13 MED ORDER — DIVALPROEX SODIUM 250 MG PO DR TAB: 250.0000 mg | DELAYED_RELEASE_TABLET | Freq: Two times a day (BID) | ORAL | Status: DC

## 2019-02-13 MED ORDER — RISPERIDONE 1 MG PO TBDP
2.0000 mg | ORAL_TABLET | Freq: Three times a day (TID) | ORAL | Status: DC | PRN
  Filled 2019-02-13: qty 2

## 2019-02-13 MED ORDER — LORAZEPAM 2 MG PO TABS
0.0000 mg | ORAL_TABLET | Freq: Four times a day (QID) | ORAL | Status: DC
  Administered 2019-02-13: 2 mg via ORAL
  Filled 2019-02-13: qty 1

## 2019-02-13 MED ORDER — VITAMIN B-1 100 MG PO TABS: 100.0000 mg | ORAL_TABLET | Freq: Every day | ORAL | Status: DC

## 2019-02-13 MED ORDER — NALTREXONE HCL 50 MG PO TABS
50.0000 mg | ORAL_TABLET | Freq: Every day | ORAL | Status: DC
  Administered 2019-02-14: 50 mg via ORAL
  Filled 2019-02-13: qty 1

## 2019-02-13 NOTE — BH Assessment (Signed)
Patient is to be admitted to [facility] by Dr. Leverne Humbles.  Attending Physician will be Dr. Weber Cooks.   Patient has been assigned to room 320, by [facility] Charge Nurse [place].   Intake Paper Work has been signed and placed on patient chart.  ER staff is aware of the admission:  Lattie Haw, ER Secretary    Dr. Cherylann Banas, ER MD   Donneta Romberg, Patient's Nurse   Butch Penny, Patient Access.

## 2019-02-13 NOTE — ED Notes (Signed)
Pt asleep, breakfast tray placed in rm.  

## 2019-02-13 NOTE — ED Notes (Signed)
She has taken a shower

## 2019-02-13 NOTE — ED Notes (Signed)
In pt's belongings bag-gray tshirt, gray gym shorts, gray socks, black high tops, black iphone, black apple watch, silver colored tongue piercing;

## 2019-02-13 NOTE — ED Notes (Signed)
BEHAVIORAL HEALTH ROUNDING Patient sleeping: No. Patient alert and oriented: yes Behavior appropriate: Yes.  ; If no, describe:  Nutrition and fluids offered: yes Toileting and hygiene offered: Yes  Sitter present: q15 minute observations and security  monitoring Law enforcement present: Yes  ODS  

## 2019-02-13 NOTE — ED Notes (Signed)

## 2019-02-13 NOTE — ED Notes (Signed)
TTS in speaking with pt.  

## 2019-02-13 NOTE — ED Notes (Signed)
ED  Is the patient under IVC or is there intent for IVC: Yes.   Is the patient medically cleared: Yes.   Is there vacancy in the ED BHU: Yes.   Is the population mix appropriate for patient: Yes.   Is the patient awaiting placement in inpatient or outpatient setting:  Has the patient had a psychiatric consult:  Consult pending Survey of unit performed for contraband, proper placement and condition of furniture, tampering with fixtures in bathroom, shower, and each patient room: Yes.  ; Findings:  APPEARANCE/BEHAVIOR Calm and cooperative NEURO ASSESSMENT Orientation: oriented x3   Hallucinations: No.None noted (Hallucinations) denies Speech: Normal Gait: normal RESPIRATORY ASSESSMENT Even  Unlabored respirations  CARDIOVASCULAR ASSESSMENT Pulses equal   regular rate  Skin warm and dry   GASTROINTESTINAL ASSESSMENT no GI complaint EXTREMITIES Full ROM  PLAN OF CARE Provide calm/safe environment. Vital signs assessed twice daily. ED BHU Assessment once each 12-hour shift. Collaborate with TTS if available or as condition indicates. Assure the ED provider has rounded once each shift. Provide and encourage hygiene. Provide redirection as needed. Assess for escalating behavior; address immediately and inform ED provider.  Assess family dynamic and appropriateness for visitation as needed: Yes.  ; If necessary, describe findings:  Educate the patient/family about BHU procedures/visitation: Yes.  ; If necessary, describe findings:

## 2019-02-13 NOTE — Progress Notes (Signed)
D - Patient was admitted from [facility] Emergency Department. Report was received from [place], [place]. Patient was pleasant during assessment. Skin check was completed with Cristie Hem, RN. Patient has superficial cuts on upper left arm. Patient has several tattoos on her body. Patient denies SI/HI/AVH and pain. Patient stated, "I have anxiety from drinking the other night. I drink to much on nights when I shouldn't be partying. I need to figure out why I am making these bad decisions."  A - Patient was compliant with medication administration per MD orders and procedures on the unit. Patient given education and oriented to the unit and her room. Patient given support and encouragement to be active in her treatment plan. Patient informed to let staff know if there are any issues or problems on the unit.   R - Patient being monitored Q 15 minutes for safety per unit protocol. Patient remains safe on the unit.

## 2019-02-13 NOTE — Plan of Care (Signed)
Patient newly admitted, hasn't had time to progress.    Problem: Education: Goal: Knowledge of [place] [facility] Education information/materials will improve Outcome: Not Progressing Goal: Emotional status will improve Outcome: Not Progressing Goal: Mental status will improve Outcome: Not Progressing Goal: Verbalization of understanding the information provided will improve Outcome: Not Progressing   Problem: Safety: Goal: Periods of time without injury will increase Outcome: Not Progressing   Problem: Education: Goal: Utilization of techniques to improve thought processes will improve Outcome: Not Progressing Goal: Knowledge of the prescribed therapeutic regimen will improve Outcome: Not Progressing   Problem: Safety: Goal: Ability to disclose and discuss suicidal ideas will improve Outcome: Not Progressing Goal: Ability to identify and utilize support systems that promote safety will improve Outcome: Not Progressing   

## 2019-02-13 NOTE — ED Notes (Signed)
PT  IVC  PENDING  GOING  TO  [facility]  TONIGHT

## 2019-02-13 NOTE — ED Notes (Signed)
Patient observed lying in bed with eyes closed  Even, unlabored respirations observed   NAD pt appears to be sleeping  I will continue to monitor along with every 15 minute visual observations and ongoing security monitoring    

## 2019-02-13 NOTE — ED Provider Notes (Signed)
[facility] Emergency Department Provider Note    First MD Initiated Contact with Patient 02/13/19 [phone]     (approximate)  I have reviewed the triage vital signs and the nursing notes.   HISTORY  Chief Complaint Suicidal    HPI Ashley Fry is a 38 y.o. female the below listed past medical history presents the ER for evaluation of several months of increasing stress anxiety depression increasing chemical dependency on alcohol as well as intermittent use of cocaine now with suicidal ideation with plan to shoot herself if she had access to a gun.  Is not currently on any antipsychotic medications.    Past Medical History:  Diagnosis Date  . Alcohol abuse   . Asthma   . Bipolar 1 disorder ([place])   . Depression   . Seizures ([place])   . Substance abuse ([place])    History reviewed. No pertinent family history. History reviewed. No pertinent surgical history. Patient Active Problem List   Diagnosis Date Noted  . De Quervain's tenosynovitis, right 06/09/2017  . Multiple sclerosis ([place]) 05/20/2017  . Headache, chronic daily 05/19/2017  . Numbness and tingling 05/19/2017  . Chronic motor tic 11/21/2014  . Headache, unspecified headache type 11/21/2014  . Seizures ([place]) 11/21/2014  . Sleep disorder 11/21/2014  . Abortion 10/24/2013  . Contraception 10/24/2013      Prior to Admission medications   Medication Sig Start Date End Date Taking? Authorizing Provider  albuterol (PROVENTIL HFA;VENTOLIN HFA) 108 (90 Base) MCG/ACT inhaler Inhale 2 puffs into the lungs every 6 (six) hours as needed for wheezing or shortness of breath. 09/13/18  Yes Merlyn Lot, MD  budesonide-formoterol [facility]) 160-4.5 MCG/ACT inhaler Inhale 2 puffs into the lungs every 12 (twelve) hours. 11/21/18  Yes Flora Lipps, MD  ibuprofen (ADVIL,MOTRIN) 600 MG tablet Take 1 tablet (600 mg total) by mouth every 8 (eight) hours as needed. 09/15/16  Yes Sable Feil, PA-C     Allergies Patient has no known allergies.    Social History Social History   Tobacco Use  . Smoking status: Current Every Day Smoker    Packs/day: 0.50    Types: Cigarettes  . Smokeless tobacco: Never Used  Substance Use Topics  . Alcohol use: Yes    Comment: daily  . Drug use: Yes    Types: Cocaine, Marijuana    Review of Systems Patient denies headaches, rhinorrhea, blurry vision, numbness, shortness of breath, chest pain, edema, cough, abdominal pain, nausea, vomiting, diarrhea, dysuria, fevers, rashes or hallucinations unless otherwise stated above in HPI. ____________________________________________   PHYSICAL EXAM:  VITAL SIGNS: Vitals:   02/13/19 0003  BP: 119/81  Pulse: 90  Resp: 18  Temp: 99 F (37.2 C)  SpO2: 98%    Constitutional: Alert and oriented.  Eyes: Conjunctivae are normal.  Head: Atraumatic. Nose: No congestion/rhinnorhea. Mouth/Throat: Mucous membranes are moist.   Neck: No stridor. Painless ROM.  Cardiovascular: Normal rate, regular rhythm. Grossly normal heart sounds.  Good peripheral circulation. Respiratory: Normal respiratory effort.  No retractions. Lungs CTAB. Gastrointestinal: Soft and nontender. No distention. No abdominal bruits. No CVA tenderness. Genitourinary:  Musculoskeletal: No lower extremity tenderness nor edema.  No joint effusions. Neurologic:  Normal speech and language. No gross focal neurologic deficits are appreciated. No facial droop Skin:  Skin is warm, dry and intact. No rash noted. Psychiatric: Mood and affect are depressed. Speech and behavior are normal.  ____________________________________________   LABS (all labs ordered are listed, but only abnormal results  are displayed)  Results for orders placed or performed during the hospital encounter of 02/13/19 (from the past 24 hour(s))  Comprehensive metabolic panel     Status: Abnormal   Collection Time: 02/13/19 12:09 AM  Result Value Ref Range   Sodium  141 135 - 145 mmol/L   Potassium 3.9 3.5 - 5.1 mmol/L   Chloride 110 98 - 111 mmol/L   CO2 23 22 - 32 mmol/L   Glucose, Bld 107 (H) 70 - 99 mg/dL   BUN 11 6 - 20 mg/dL   Creatinine, Ser 0.92 0.44 - 1.00 mg/dL   Calcium 8.9 8.9 - 10.3 mg/dL   Total Protein 7.7 6.5 - 8.1 g/dL   Albumin 4.4 3.5 - 5.0 g/dL   AST 20 15 - 41 U/L   ALT 16 0 - 44 U/L   Alkaline Phosphatase 51 38 - 126 U/L   Total Bilirubin 0.5 0.3 - 1.2 mg/dL   GFR calc non Af Amer >60 >60 mL/min   GFR calc Af Amer >60 >60 mL/min   Anion gap 8 5 - 15  Ethanol     Status: Abnormal   Collection Time: 02/13/19 12:09 AM  Result Value Ref Range   Alcohol, Ethyl (B) 142 (H) <81 mg/dL  Salicylate level     Status: None   Collection Time: 02/13/19 12:09 AM  Result Value Ref Range   Salicylate Lvl <8.2 2.8 - 30.0 mg/dL  Acetaminophen level     Status: Abnormal   Collection Time: 02/13/19 12:09 AM  Result Value Ref Range   Acetaminophen (Tylenol), Serum <10 (L) 10 - 30 ug/mL  cbc     Status: Abnormal   Collection Time: 02/13/19 12:09 AM  Result Value Ref Range   WBC 7.4 4.0 - 10.5 K/uL   RBC 4.85 3.87 - 5.11 MIL/uL   Hemoglobin 11.9 (L) 12.0 - 15.0 g/dL   HCT 36.2 36.0 - 46.0 %   MCV 74.6 (L) 80.0 - 100.0 fL   MCH 24.5 (L) 26.0 - 34.0 pg   MCHC 32.9 30.0 - 36.0 g/dL   RDW 14.6 11.5 - 15.5 %   Platelets 297 150 - 400 K/uL   nRBC 0.0 0.0 - 0.2 %  Urine Drug Screen, Qualitative     Status: Abnormal   Collection Time: 02/13/19 12:36 AM  Result Value Ref Range   Tricyclic, Ur Screen NONE DETECTED NONE DETECTED   Amphetamines, Ur Screen NONE DETECTED NONE DETECTED   MDMA (Ecstasy)Ur Screen NONE DETECTED NONE DETECTED   Cocaine Metabolite,Ur [place] POSITIVE (A) NONE DETECTED   Opiate, Ur Screen NONE DETECTED NONE DETECTED   Phencyclidine (PCP) Ur S NONE DETECTED NONE DETECTED   Cannabinoid 50 Ng, Ur [place] NONE DETECTED NONE DETECTED   Barbiturates, Ur Screen NONE DETECTED NONE DETECTED   Benzodiazepine, Ur Scrn NONE DETECTED  NONE DETECTED   Methadone Scn, Ur NONE DETECTED NONE DETECTED   ____________________________________________ ____________________________________________  RADIOLOGY   ____________________________________________   PROCEDURES  Procedure(s) performed:  Procedures    Critical Care performed: no ____________________________________________   INITIAL IMPRESSION / ASSESSMENT AND PLAN / ED COURSE  Pertinent labs & imaging results that were available during my care of the patient were reviewed by me and considered in my medical decision making (see chart for details).   DDX: Psychosis, delirium, medication effect, noncompliance, polysubstance abuse, Si, Hi, depression   Micaela D Mahn is a 38 y.o. who presents to the ED with for evaluation of si with substance abuse  and chemical dependency.  Patient has psych history of bipolar d/o and polysubstance abuse.  Laboratory testing was ordered to evaluation for underlying electrolyte derangement or signs of underlying organic pathology to explain today's presentation.  Based on history and physical and laboratory evaluation, it appears that the patient's presentation is 2/2 underlying psychiatric disorder and will require further evaluation and management by inpatient psychiatry.  Patient was  made an IVC due to si with substance abuse.  Placed on ciwa protocol.  Disposition pending psychiatric evaluation.      The patient was evaluated in Emergency Department today for the symptoms described in the history of present illness. He/she was evaluated in the context of the global COVID-19 pandemic, which necessitated consideration that the patient might be at risk for infection with the SARS-CoV-2 virus that causes COVID-19. Institutional protocols and algorithms that pertain to the evaluation of patients at risk for COVID-19 are in a state of rapid change based on information released by regulatory bodies including the CDC and federal and state  organizations. These policies and algorithms were followed during the patient's care in the ED.  As part of my medical decision making, I reviewed the following data within the [place] notes reviewed and incorporated, Labs reviewed, notes from prior ED visits and [place] Controlled Substance Database   ____________________________________________   FINAL CLINICAL IMPRESSION(S) / ED DIAGNOSES  Final diagnoses:  Suicidal ideation  Polysubstance abuse ([place])      NEW MEDICATIONS STARTED DURING THIS VISIT:  New Prescriptions   No medications on file     Note:  This document was prepared using Dragon voice recognition software and may include unintentional dictation errors.    Merlyn Lot, MD 02/13/19 [phone]

## 2019-02-13 NOTE — Tx Team (Signed)
Initial Treatment Plan 02/13/2019 8:47 PM Ashley Fry NDL:[id number]    PATIENT STRESSORS: Occupational concerns Substance abuse   PATIENT STRENGTHS: Motivation for treatment/growth Supportive family/friends   PATIENT IDENTIFIED PROBLEMS: Substance abuse Anxiety Depression                     DISCHARGE CRITERIA:  Improved stabilization in mood, thinking, and/or behavior Verbal commitment to aftercare and medication compliance  PRELIMINARY DISCHARGE PLAN: Outpatient therapy Return to previous living arrangement  PATIENT/FAMILY INVOLVEMENT: This treatment plan has been presented to and reviewed with the patient, Ashley Fry. The patient has been given the opportunity to ask questions and make suggestions.  Mallie Darting, RN 02/13/2019, 8:47 PM

## 2019-02-13 NOTE — Consult Note (Signed)
[facility] Consult   Reason for Consult:  Suicidal  Referring Physician:  Dr. Bland Span Patient Identification: Ashley Fry MRN:  [id number] Principal Diagnosis: Bipolar 1 disorder, mixed, severe ([place]) Diagnosis:  Principal Problem:   Bipolar 1 disorder, mixed, severe ([place]) Active Problems:   Seizures ([place])   Sleep disorder  Patient seen, chart is reviewed.  Collateral obtained from patient's significant other,Ashley Fry [phone] agrees with plan of care for patient admission and follow-up with substance use rehabilitation IOP.  Ms. Oswaldo Milian insurers patient son will be cared for while patient is hospitalized. Total Time spent with patient: 1.5 hours  Subjective: "I need help with my addiction, when I use I feel suicidal."  HPI: Ashley Fry is a 38 y.o. female patient who presented to the to the ER for evaluation of several months of increasing stress anxiety depression increasing chemical dependency on alcohol as well as intermittent use of cocaine now with suicidal ideation with plan to shoot herself if she had access to a gun.  Is not currently on any antipsychotic medications.  Per TTS intake: Ashley Fry is an 38 y.o. female. Ms. Catalina arrived to the ED by way of law enforcement.  She reports that "depression and suicidal thoughts" is what brought her in tonight.  She reports that she has a lot going on financially and every day life is overwhelming.  She reports no change in her appetite. She states that she has not slept well for over 5 years.  She reports no change around social activities.  She reports that she uses cocaine and alcohol "freely".  She reports a history of anxiety, but denied current symptoms.  She denied having auditory or visual hallucinations. She denied homicidal ideation or intent.  She denied having a plan to kill or harm herself.  She reports stress from her past, jobs, not being honest with her family about stuff.   (patient did not want to elaborate).  She reports prior diagnosis of PTSD, Bipolar Disorder, and Schizophrenia.  She reports that she has not been on her medications "For a while".  She is not currently being followed by a mental health provider.      On evaluation today, patient reports that she was diagnosed with bipolar disorder in 2003.  She describes that she has had difficulty maintaining a job due to her illness, however she also notes that she has not stayed on medication for bipolar illness.  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  During this time, patient began abusing cocaine and alcohol.  Patient states that, "I make poor choices, and then I feel really guilty."  Patient reports that she lives with her significant other of 5 years, Ashley Fry, and her 26 year old son. She states she is increasingly depressed due to her father with whom she was close having 2 strokes recently and him being impaired.  Patient is currently reporting depressed mood with decreased appetite, impaired sleep, poor concentration, low energy, guilt and suicidal thoughts.  Patient is also expressing desire to get help for substance use rehabilitation.  Patient continues to have passive suicidal thoughts, no active plan at this time.  She denies HI.  She denies AVH currently.   Past Psychiatric History: Bipolar 1 illness, alcohol abuse, cocaine abuse  Risk to Self: Suicidal Ideation: Yes-Currently Present Suicidal Intent: No Is patient at risk for suicide?: Yes Suicidal Plan?: No Access  to Means: No What has been your use of drugs/alcohol within the last 12 months?: Use of alcohol and cocaine How many times?: 1 Other Self Harm Risks: denied Triggers for Past Attempts: Unknown Intentional Self Injurious Behavior: None Risk to Others: Homicidal Ideation: No Thoughts of Harm to Others: No Current Homicidal Intent: No Current  Homicidal Plan: No Access to Homicidal Means: No Identified Victim: None identified History of harm to others?: No Assessment of Violence: None Noted Violent Behavior Description: denied Does patient have access to weapons?: Yes (Comment)(Kitchen knives) Criminal Charges Pending?: No Does patient have a court date: No Prior Inpatient Therapy: Prior Inpatient Therapy: Yes Prior Therapy Dates: 2002 Prior Therapy Facilty/Provider(s): [facility] Reason for Treatment: Bipolar Disorder, Schizophrenia, PTSD Prior Outpatient Therapy: Prior Outpatient Therapy: Yes Prior Therapy Dates: 2003 Prior Therapy Facilty/Provider(s): [facility] Reason for Treatment: Bipolar Disorder, Schizophrenia, PTSD Does patient have an ACCT team?: No Does patient have Intensive In-House Services?  : No Does patient have [facility] services? : No Does patient have P4CC services?: No  Past Medical History:  Past Medical History:  Diagnosis Date  . Alcohol abuse   . Asthma   . Bipolar 1 disorder ([place])   . Depression   . Seizures ([place])   . Substance abuse ([place])    History reviewed. No pertinent surgical history. Family History: History reviewed. No pertinent family history.    Family Psychiatric  History: None   Social History:  Social History   Substance and Sexual Activity  Alcohol Use Yes   Comment: daily     Social History   Substance and Sexual Activity  Drug Use Yes  . Types: Cocaine, Marijuana    Social History   Socioeconomic History  . Marital status: Single    Spouse name: Not on file  . Number of children: Not on file  . Years of education: Not on file  . Highest education level: Not on file  Occupational History  . Not on file  Social Needs  . Financial resource strain: Not on file  . Food insecurity:    Worry: Not on file    Inability: Not on file  . Transportation needs:    Medical: Not on file    Non-medical: Not on file  Tobacco Use  . Smoking status: Current Every Day Smoker     Packs/day: 0.50    Types: Cigarettes  . Smokeless tobacco: Never Used  Substance and Sexual Activity  . Alcohol use: Yes    Comment: daily  . Drug use: Yes    Types: Cocaine, Marijuana  . Sexual activity: Yes    Birth control/protection: I.U.D.    Comment: Mirena   Lifestyle  . Physical activity:    Days per week: Not on file    Minutes per session: Not on file  . Stress: Not on file  Relationships  . Social connections:    Talks on phone: Not on file    Gets together: Not on file    Attends religious service: Not on file    Active member of club or organization: Not on file    Attends meetings of clubs or organizations: Not on file    Relationship status: Not on file  Other Topics Concern  . Not on file  Social History Narrative  . Not on file   Additional Social History:   Patient lives with her significant other of 5 years and patient's 44 year old son. Patient currently laid off work as a Furniture conservator/restorer due to the  coronavirus. Patient has been abusing cocaine and alcohol.  Patient describes she feels unsupported by her mother.  Patient describes that her father was a good support for her, however he has had 2 strokes recently.   Allergies:  No Known Allergies  Labs:  Results for orders placed or performed during the hospital encounter of 02/13/19 (from the past 48 hour(s))  Comprehensive metabolic panel     Status: Abnormal   Collection Time: 02/13/19 12:09 AM  Result Value Ref Range   Sodium 141 135 - 145 mmol/L   Potassium 3.9 3.5 - 5.1 mmol/L   Chloride 110 98 - 111 mmol/L   CO2 23 22 - 32 mmol/L   Glucose, Bld 107 (H) 70 - 99 mg/dL   BUN 11 6 - 20 mg/dL   Creatinine, Ser 0.92 0.44 - 1.00 mg/dL   Calcium 8.9 8.9 - 10.3 mg/dL   Total Protein 7.7 6.5 - 8.1 g/dL   Albumin 4.4 3.5 - 5.0 g/dL   AST 20 15 - 41 U/L   ALT 16 0 - 44 U/L   Alkaline Phosphatase 51 38 - 126 U/L   Total Bilirubin 0.5 0.3 - 1.2 mg/dL   GFR calc non Af Amer >60 >60 mL/min   GFR calc Af  Amer >60 >60 mL/min   Anion gap 8 5 - 15    Comment: Performed at [facility], [address] [place], [place] [address]  Ethanol     Status: Abnormal   Collection Time: 02/13/19 12:09 AM  Result Value Ref Range   Alcohol, Ethyl (B) 142 (H) <10 mg/dL    Comment: (NOTE) Lowest detectable limit for serum alcohol is 10 mg/dL. For medical purposes only. Performed at [facility], [address] [place], [place] [address]   Salicylate level     Status: None   Collection Time: 02/13/19 12:09 AM  Result Value Ref Range   Salicylate Lvl <2.8 2.8 - 30.0 mg/dL    Comment: Performed at [facility], [address] [place], [place] [address]  Acetaminophen level     Status: Abnormal   Collection Time: 02/13/19 12:09 AM  Result Value Ref Range   Acetaminophen (Tylenol), Serum <10 (L) 10 - 30 ug/mL    Comment: (NOTE) Therapeutic concentrations vary significantly. A range of 10-30 ug/mL  may be an effective concentration for many patients. However, some  are best treated at concentrations outside of this range. Acetaminophen concentrations >150 ug/mL at 4 hours after ingestion  and >50 ug/mL at 12 hours after ingestion are often associated with  toxic reactions. Performed at [facility], [address] [place], [place] [address]   cbc     Status: Abnormal   Collection Time: 02/13/19 12:09 AM  Result Value Ref Range   WBC 7.4 4.0 - 10.5 K/uL   RBC 4.85 3.87 - 5.11 MIL/uL   Hemoglobin 11.9 (L) 12.0 - 15.0 g/dL   HCT 36.2 36.0 - 46.0 %   MCV 74.6 (L) 80.0 - 100.0 fL   MCH 24.5 (L) 26.0 - 34.0 pg   MCHC 32.9 30.0 - 36.0 g/dL   RDW 14.6 11.5 - 15.5 %   Platelets 297 150 - 400 K/uL   nRBC 0.0 0.0 - 0.2 %    Comment: Performed at [facility], [address] [place], [place] [address]  Hemoglobin A1c     Status: Abnormal   Collection Time: 02/13/19 12:09 AM  Result Value Ref Range  Hgb A1c MFr Bld 6.0 (H) 4.8 - 5.6 %    Comment:  (NOTE) Pre diabetes:          5.7%-6.4% Diabetes:              >6.4% Glycemic control for   <7.0% adults with diabetes    Mean Plasma Glucose 125.5 mg/dL    Comment: Performed at [facility], [place] [address], [place], [place] [address]  Lipid panel     Status: None   Collection Time: 02/13/19 12:09 AM  Result Value Ref Range   Cholesterol 183 0 - 200 mg/dL   Triglycerides 89 <150 mg/dL   HDL 67 >40 mg/dL   Total CHOL/HDL Ratio 2.7 RATIO   VLDL 18 0 - 40 mg/dL   LDL Cholesterol 98 0 - 99 mg/dL    Comment:        Total Cholesterol/HDL:CHD Risk Coronary Heart Disease Risk Table                     Men   Women  1/2 Average Risk   3.4   3.3  Average Risk       5.0   4.4  2 X Average Risk   9.6   7.1  3 X Average Risk  23.4   11.0        Use the calculated Patient Ratio above and the CHD Risk Table to determine the patient's CHD Risk.        ATP III CLASSIFICATION (LDL):  <100     mg/dL   Optimal  100-129  mg/dL   Near or Above                    Optimal  130-159  mg/dL   Borderline  160-189  mg/dL   High  >190     mg/dL   Very High Performed at [facility], [address] [place], [place] [address]   TSH     Status: None   Collection Time: 02/13/19 12:09 AM  Result Value Ref Range   TSH 1.358 0.350 - 4.500 uIU/mL    Comment: Performed by a 3rd Generation assay with a functional sensitivity of <=0.01 uIU/mL. Performed at [facility], [address] [place], [place] [address]   Urine Drug Screen, Qualitative     Status: Abnormal   Collection Time: 02/13/19 12:36 AM  Result Value Ref Range   Tricyclic, Ur Screen NONE DETECTED NONE DETECTED   Amphetamines, Ur Screen NONE DETECTED NONE DETECTED   MDMA (Ecstasy)Ur Screen NONE DETECTED NONE DETECTED   Cocaine Metabolite,Ur [place] POSITIVE (A) NONE DETECTED   Opiate, Ur Screen NONE DETECTED NONE DETECTED   Phencyclidine (PCP) Ur S NONE DETECTED NONE DETECTED   Cannabinoid 50 Ng, Ur [place] NONE DETECTED  NONE DETECTED   Barbiturates, Ur Screen NONE DETECTED NONE DETECTED   Benzodiazepine, Ur Scrn NONE DETECTED NONE DETECTED   Methadone Scn, Ur NONE DETECTED NONE DETECTED    Comment: (NOTE) Tricyclics + metabolites, urine    Cutoff 1000 ng/mL Amphetamines + metabolites, urine  Cutoff 1000 ng/mL MDMA (Ecstasy), urine              Cutoff 500 ng/mL Cocaine Metabolite, urine          Cutoff 300 ng/mL Opiate + metabolites, urine        Cutoff 300 ng/mL Phencyclidine (PCP), urine         Cutoff 25  ng/mL Cannabinoid, urine                 Cutoff 50 ng/mL Barbiturates + metabolites, urine  Cutoff 200 ng/mL Benzodiazepine, urine              Cutoff 200 ng/mL Methadone, urine                   Cutoff 300 ng/mL The urine drug screen provides only a preliminary, unconfirmed analytical test result and should not be used for non-medical purposes. Clinical consideration and professional judgment should be applied to any positive drug screen result due to possible interfering substances. A more specific alternate chemical method must be used in order to obtain a confirmed analytical result. Gas chromatography / mass spectrometry (GC/MS) is the preferred confirmat ory method. Performed at [facility], [address] [place], [place] [address]   SARS Coronavirus 2 (CEPHEID - Performed in [facility] hospital lab), Hosp Order     Status: None   Collection Time: 02/13/19  4:15 PM  Result Value Ref Range   SARS Coronavirus 2 NEGATIVE NEGATIVE    Comment: (NOTE) If result is NEGATIVE SARS-CoV-2 target nucleic acids are NOT DETECTED. The SARS-CoV-2 RNA is generally detectable in upper and lower  respiratory specimens during the acute phase of infection. The lowest  concentration of SARS-CoV-2 viral copies this assay can detect is 250  copies / mL. A negative result does not preclude SARS-CoV-2 infection  and should not be used as the sole basis for treatment or other  patient management  decisions.  A negative result may occur with  improper specimen collection / handling, submission of specimen other  than nasopharyngeal swab, presence of viral mutation(s) within the  areas targeted by this assay, and inadequate number of viral copies  (<250 copies / mL). A negative result must be combined with clinical  observations, patient history, and epidemiological information. If result is POSITIVE SARS-CoV-2 target nucleic acids are DETECTED. The SARS-CoV-2 RNA is generally detectable in upper and lower  respiratory specimens dur ing the acute phase of infection.  Positive  results are indicative of active infection with SARS-CoV-2.  Clinical  correlation with patient history and other diagnostic information is  necessary to determine patient infection status.  Positive results do  not rule out bacterial infection or co-infection with other viruses. If result is PRESUMPTIVE POSTIVE SARS-CoV-2 nucleic acids MAY BE PRESENT.   A presumptive positive result was obtained on the submitted specimen  and confirmed on repeat testing.  While 2019 novel coronavirus  (SARS-CoV-2) nucleic acids may be present in the submitted sample  additional confirmatory testing may be necessary for epidemiological  and / or clinical management purposes  to differentiate between  SARS-CoV-2 and other Sarbecovirus currently known to infect humans.  If clinically indicated additional testing with an alternate test  methodology [phone]) is advised. The SARS-CoV-2 RNA is generally  detectable in upper and lower respiratory sp ecimens during the acute  phase of infection. The expected result is Negative. Fact Sheet for Patients:  StrictlyIdeas.no Fact Sheet for Healthcare Providers: BankingDealers.co.za This test is not yet approved or cleared by the [place] FDA and has been authorized for detection and/or diagnosis of SARS-CoV-2 by FDA under an  Emergency Use Authorization (EUA).  This EUA will remain in effect (meaning this test can be used) for the duration of the COVID-19 declaration under Section 564(b)(1) of the Act, 21 U.S.C. section 360bbb-3(b)(1), unless the authorization is  terminated or revoked sooner. Performed at [facility], [address] [place], [place] [address]     No current facility-administered medications for this encounter.    No current outpatient medications on file.   Facility-Administered Medications Ordered in Other Encounters  Medication Dose Route Frequency Provider Last Rate Last Dose  . acetaminophen (TYLENOL) tablet 650 mg  650 mg Oral Q6H PRN Lavella Hammock, MD      . albuterol (VENTOLIN HFA) 108 (90 Base) MCG/ACT inhaler 2 puff  2 puff Inhalation Q6H PRN Lavella Hammock, MD      . alum & mag hydroxide-simeth (MAALOX/MYLANTA) 200-200-20 MG/5ML suspension 30 mL  30 mL Oral Q4H PRN Lavella Hammock, MD      . divalproex (DEPAKOTE) DR tablet 250 mg  250 mg Oral BID Clapacs, Madie Reno, MD   250 mg at 02/13/19 2125  . [START ON 02/14/2019] LORazepam (ATIVAN) injection 0-4 mg  0-4 mg Intravenous Q6H Lavella Hammock, MD       Or  . Derrill Memo ON 02/14/2019] LORazepam (ATIVAN) tablet 0-4 mg  0-4 mg Oral Q6H Lavella Hammock, MD      . Derrill Memo ON 02/15/2019] LORazepam (ATIVAN) injection 0-4 mg  0-4 mg Intravenous Q12H Lavella Hammock, MD       Or  . Derrill Memo ON 02/15/2019] LORazepam (ATIVAN) tablet 0-4 mg  0-4 mg Oral Q12H Lavella Hammock, MD      . magnesium hydroxide (MILK OF MAGNESIA) suspension 30 mL  30 mL Oral Daily PRN Lavella Hammock, MD      . mometasone-formoterol [facility]) 200-5 MCG/ACT inhaler 2 puff  2 puff Inhalation BID Lavella Hammock, MD   2 puff at 02/13/19 2124  . [START ON 02/14/2019] naltrexone (DEPADE) tablet 50 mg  50 mg Oral Daily Lavella Hammock, MD      . paliperidone [facility] SUSTENNA) injection 234 mg  234 mg Intramuscular Once Lavella Hammock, MD       Followed by  . [START  ON 03/13/2019] paliperidone (INVEGA SUSTENNA) injection 156 mg  156 mg Intramuscular Q28 days Lavella Hammock, MD      . Derrill Memo ON 02/14/2019] paliperidone (INVEGA) 24 hr tablet 3 mg  3 mg Oral QHS Lavella Hammock, MD      . risperiDONE (RISPERDAL M-TABS) disintegrating tablet 2 mg  2 mg Oral Q8H PRN Lavella Hammock, MD       And  . ziprasidone (GEODON) injection 20 mg  20 mg Intramuscular PRN Lavella Hammock, MD      . Derrill Memo ON 02/14/2019] thiamine (VITAMIN B-1) tablet 100 mg  100 mg Oral Daily Lavella Hammock, MD       Or  . Derrill Memo ON 02/14/2019] thiamine (B-1) injection 100 mg  100 mg Intravenous Daily Lavella Hammock, MD        Musculoskeletal: Strength & Muscle Tone: within normal limits Gait & Station: normal Patient leans: N/A  Psychiatric Specialty Exam: Physical Exam  Nursing note and vitals reviewed. Constitutional: She is oriented to person, place, and time. She appears well-developed and well-nourished. No distress.  HENT:  Head: Normocephalic and atraumatic.  Eyes: EOM are normal.  Neck: Normal range of motion.  Cardiovascular: Normal rate and regular rhythm.  Respiratory: Effort normal. No respiratory distress.  Musculoskeletal: Normal range of motion.  Neurological: She is alert and oriented to person, place, and time.    Review of Systems  Psychiatric/Behavioral: Positive for depression,  substance abuse and suicidal ideas. Negative for hallucinations and memory loss. The patient is nervous/anxious and has insomnia.   All other systems reviewed and are negative.   Blood pressure 125/79, pulse 84, temperature 98 F (36.7 C), temperature source Oral, resp. rate 16, height 5' (1.524 m), weight 67.1 kg, SpO2 99 %.Body mass index is 28.9 kg/m.  General Appearance: Casual  Eye Contact:  Fair  Speech:  Clear and Coherent  Volume:  Normal  Mood:  Anxious and Depressed  Affect:  Congruent  Thought Process:  Goal Directed and Descriptions of Associations: Intact   Orientation:  Full (Time, Place, and Person)  Thought Content:  Logical and Hallucinations: None  Suicidal Thoughts:  Yes.  without intent/plan  Homicidal Thoughts:  No  Memory:  good  Judgement:  Good  Insight:  Good  Psychomotor Activity:  Normal  Concentration:  Concentration: Good  Recall:  Good  Fund of Knowledge:  Good  Language:  Good  Akathisia:  No  Handed:  Right  AIMS (if indicated):     Assets:  Communication Skills Desire for Improvement Financial Resources/Insurance Housing Intimacy Resilience Social Support  ADL's:  Intact  Cognition:  WNL  Sleep:   decreased     Treatment Plan Summary: Continue involuntary commitment.  Daily contact with patient to assess and evaluate symptoms and progress in treatment and Medication management  CIWA protocol  .  thiamine (VITAMIN B-1) tablet 100 mg  . thiamine (B-1) injection 100 mg  .  acetaminophen (TYLENOL) tablet 650 mg  . (INVEGA) 24 hr tablet 3 mg once  . paliperidone (INVEGA) 24 hr tablet 3 mg then Q HS for mood stabilization  . paliperidone (INVEGA SUSTENNA) injection 234 mg today, then  . paliperidone (INVEGA SUSTENNA) injection 156 mg on 02/17/2019 then Q 28 days  . naltrexone (DEPADE) tablet 50 mg to decrease cocaine and alcohol cravings.  Patient is interested in monthly Depade  injection.  . divalproex (DEPAKOTE) DR tablet 250 mg  Patient provided with risks, benefits, side effects, adverse effects of medication.  She is allowed time for questions.  Reviewed treatment plan with patient significant other who is also a long time for questioning and agrees to plan of care.  Hgb A1c, Lipids, and TSH added to routine labs. COVID-19 screen ordered.  Disposition: Recommend psychiatric Inpatient admission when medically cleared. Supportive therapy provided about ongoing stressors.  Orders placed for admission. Patient encouraged to seek consult outpatient treatment for substance use rehabilitation after  discharge from inpatient psychiatric hospitalization.  Lavella Hammock, MD 02/13/2019 10:19 PM

## 2019-02-13 NOTE — ED Triage Notes (Addendum)
Pt arrived voluntarily with [employer] officer Crissie Sickles with c/o feeling suicidal for 10 years; pt says if she had a gun she'd shoot herself; multiple superficial lacerations to left arm; pt calm and cooperative; says she has several issues that have piled up on her-financial and problems at home; pt admits to drinking 1 pint of liquor today as well as "a lot of cocaine"; pt says she is a daily drinker and has had seizures when she's tried to stop;

## 2019-02-13 NOTE — BH Assessment (Signed)
Assessment Note  Ashley Fry is an 38 y.o. female. Ashley Fry arrived to the ED by way of law enforcement.  She reports that "depression and suicidal thoughts" is what brought her in tonight.  She reports that she has a lot going on financially and every day life is overwhelming.  She reports no change in her appetite. She states that she has not slept well for over 5 years.  She reports no change around social activities.  She reports that she uses cocaine and alcohol "freely".  She reports a history of anxiety, but denied current symptoms.  She denied having auditory or visual hallucinations. She denied homicidal ideation or intent.  She denied having a plan to kill or harm herself.  She reports stress from her past, jobs, not being honest with her family about stuff.  (patient did not want to elaborate).  She reports prior diagnosis of PTSD, Bipolar Disorder, and Schizophrenia.  She reports that she has not been on her medications "For a while".  She is not currently being followed by a mental health provider.      Diagnosis: Depression, Substance misuse  Past Medical History:  Past Medical History:  Diagnosis Date  . Alcohol abuse   . Asthma   . Bipolar 1 disorder ([place])   . Depression   . Seizures ([place])   . Substance abuse ([address])     History reviewed. No pertinent surgical history.  Family History: History reviewed. No pertinent family history.  Social History:  reports that she has been smoking cigarettes. She has been smoking about 0.50 packs per day. She has never used smokeless tobacco. She reports current alcohol use. She reports current drug use. Drugs: Cocaine and Marijuana.  Additional Social History:  Alcohol / Drug Use History of alcohol / drug use?: Yes Substance #1 Name of Substance 1: Alcohol 1 - Age of First Use: 15 1 - Amount (size/oz): 1 pint to a fifth of liquor 1 - Frequency: 5 days a week 1 - Last Use / Amount: 02/12/2019 Substance #2 Name of Substance 2:  Cocaine 2 - Age of First Use: 19 2 - Amount (size/oz): ($40-80) 2 - Frequency: 3 days a week 2 - Last Use / Amount: 02/12/2019  CIWA: CIWA-Ar BP: 119/81 Pulse Rate: 90 COWS:    Allergies: No Known Allergies  Home Medications: (Not in a hospital admission)   OB/GYN Status:  No LMP recorded. (Menstrual status: IUD).  General Assessment Data Location of Assessment: [facility] ED TTS Assessment: In system Is this a Tele or Face-to-Face Assessment?: Face-to-Face Is this an Initial Assessment or a Re-assessment for this encounter?: Initial Assessment Patient Accompanied by:: N/A Language Other than English: No Living Arrangements: Other (Comment)(private residence) What gender do you identify as?: Female Marital status: Single Pregnancy Status: No Living Arrangements: Alone Can pt return to current living arrangement?: Yes Admission Status: Voluntary Is patient capable of signing voluntary admission?: Yes Referral Source: Self/Family/Friend Insurance type: Medicaid  Medical Screening Exam ([place]) Medical Exam completed: Yes  Crisis Care Plan Living Arrangements: Alone Legal Guardian: Other:(Self) Name of Psychiatrist: None Name of Therapist: None  Education Status Is patient currently in school?: No Is the patient employed, unemployed or receiving disability?: Unemployed  Risk to self with the past 6 months Suicidal Ideation: Yes-Currently Present Has patient been a risk to self within the past 6 months prior to admission? : No Suicidal Intent: No Has patient had any suicidal intent within the past 6 months  prior to admission? : No Is patient at risk for suicide?: Yes Suicidal Plan?: No Has patient had any suicidal plan within the past 6 months prior to admission? : No Access to Means: No What has been your use of drugs/alcohol within the last 12 months?: Use of alcohol and cocaine Previous Attempts/Gestures: Yes How many times?: 1 Other Self Harm Risks:  denied Triggers for Past Attempts: Unknown Intentional Self Injurious Behavior: None Family Suicide History: No Recent stressful life event(s): Financial Problems, Other (Comment)(Family stressors) Persecutory voices/beliefs?: No Depression: Yes Depression Symptoms: Guilt Substance abuse history and/or treatment for substance abuse?: Yes Suicide prevention information given to non-admitted patients: Not applicable  Risk to Others within the past 6 months Homicidal Ideation: No Does patient have any lifetime risk of violence toward others beyond the six months prior to admission? : No Thoughts of Harm to Others: No Current Homicidal Intent: No Current Homicidal Plan: No Access to Homicidal Means: No Identified Victim: None identified History of harm to others?: No Assessment of Violence: None Noted Violent Behavior Description: denied Does patient have access to weapons?: Yes (Comment)(Kitchen knives) Criminal Charges Pending?: No Does patient have a court date: No Is patient on probation?: No  Psychosis Hallucinations: None noted Delusions: None noted  Mental Status Report Appearance/Hygiene: In scrubs Eye Contact: Poor Motor Activity: Restlessness Speech: Logical/coherent Level of Consciousness: Alert Mood: Anxious Affect: Appropriate to circumstance Anxiety Level: Minimal Thought Processes: Coherent Judgement: Unimpaired Orientation: Appropriate for developmental age Obsessive Compulsive Thoughts/Behaviors: None  Cognitive Functioning Concentration: Poor Memory: Recent Intact Is patient IDD: No Insight: Fair Impulse Control: Fair Appetite: Good Have you had any weight changes? : No Change Sleep: No Change(history of poor sleep) Vegetative Symptoms: None  ADLScreening [facility] Assessment Services) Patient's cognitive ability adequate to safely complete daily activities?: Yes Patient able to express need for assistance with ADLs?: Yes Independently performs  ADLs?: Yes (appropriate for developmental age)  Prior Inpatient Therapy Prior Inpatient Therapy: Yes Prior Therapy Dates: 2002 Prior Therapy Facilty/Provider(s): [facility] Reason for Treatment: Bipolar Disorder, Schizophrenia, PTSD  Prior Outpatient Therapy Prior Outpatient Therapy: Yes Prior Therapy Dates: 2003 Prior Therapy Facilty/Provider(s): [facility] Reason for Treatment: Bipolar Disorder, Schizophrenia, PTSD Does patient have an ACCT team?: No Does patient have Intensive In-House Services?  : No Does patient have [facility] services? : No Does patient have P4CC services?: No  ADL Screening (condition at time of admission) Patient's cognitive ability adequate to safely complete daily activities?: Yes Is the patient deaf or have difficulty hearing?: No Does the patient have difficulty seeing, even when wearing glasses/contacts?: No Does the patient have difficulty concentrating, remembering, or making decisions?: No Patient able to express need for assistance with ADLs?: Yes Does the patient have difficulty dressing or bathing?: No Independently performs ADLs?: Yes (appropriate for developmental age) Does the patient have difficulty walking or climbing stairs?: No Weakness of Legs: Right(numbness at times) Weakness of Arms/Hands: None  Home Assistive Devices/Equipment Home Assistive Devices/Equipment: None    Abuse/Neglect Assessment (Assessment to be complete while patient is alone) Abuse/Neglect Assessment Can Be Completed: (Denied a history of abuse)     Advance Directives (For Healthcare) Does Patient Have a Medical Advance Directive?: No Would patient like information on creating a medical advance directive?: No - Patient declined          Disposition:  Disposition Initial Assessment Completed for this Encounter: Yes  On Site Evaluation by:   Reviewed with Physician:    Elmer Bales 02/13/2019 1:25 AM

## 2019-02-13 NOTE — ED Notes (Signed)
Psychiatry is currently at her bedside

## 2019-02-14 DIAGNOSIS — F1010 Alcohol abuse, uncomplicated: Secondary | ICD-10-CM

## 2019-02-14 DIAGNOSIS — F1410 Cocaine abuse, uncomplicated: Secondary | ICD-10-CM

## 2019-02-14 DIAGNOSIS — F1994 Other psychoactive substance use, unspecified with psychoactive substance-induced mood disorder: Secondary | ICD-10-CM

## 2019-02-14 LAB — PREGNANCY, URINE: Preg Test, Ur: NEGATIVE

## 2019-02-14 MED ORDER — TRAZODONE HCL 100 MG PO TABS
100.0000 mg | ORAL_TABLET | Freq: Once | ORAL | Status: AC
  Administered 2019-02-14: 100 mg via ORAL
  Filled 2019-02-14: qty 1

## 2019-02-14 NOTE — Progress Notes (Signed)
Pt states depression 5/10 and anxiety 8/10. Pt staes she slept "pretty good". Pt has a goal "to be positive". Pt was educated on care plan and verbalizes understanding. Collier Bullock RN

## 2019-02-14 NOTE — Progress Notes (Signed)
Recreation Therapy Notes   Date: 02/14/2019  Time: 9:30 am  Location: Craft room  Behavioral response: Appropriate   Intervention Topic: Anger Management   Discussion/Intervention:  Group content on today was focused on anger management. The group defined anger and reasons they become angry. Individuals expressed negative way they have dealt with anger in the past. Patients stated some positive ways they could deal with anger in the future. The group described how anger can affect your health and daily plans. Individuals participated in the intervention "Score your anger" where they had a chance to answer questions about themselves and get a score of their anger.   Clinical Observations/Feedback:  Patient came to group late due to unknown reasons. Participant was focused on what peers and staff had to say about anger management. Individual was social with staff and peers while participating in the intervention. Dariann Huckaba LRT/CTRS         Myda Detwiler 02/14/2019 10:49 AM

## 2019-02-14 NOTE — BHH Group Notes (Signed)

## 2019-02-14 NOTE — BHH Counselor (Signed)
Adult Comprehensive Assessment  Patient ID: Ashley Fry, female   DOB: 03-16-1981, 38 y.o.   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, music / Learning stressors: None reported Employment / Job issues: Unemployed due to [employer] Family Relationships: Has positive relationships with her son and girlfriend Museum/gallery curator / Lack of resources (include bankruptcy): Limited income Housing / Lack of housing: Stable housing Physical health (include injuries & life threatening diseases): None reported Social relationships: Pt says she likes helping others Substance abuse: Cocaine and alcohol use Bereavement / Loss: None reported  Living/Environment/Situation:  Living Arrangements: Spouse/significant other, Children Living conditions (as described by patient or guardian): "Good" Who else lives in the home?: Girlfriend, son How long has patient lived in current situation?: 8 yrs What is atmosphere in current home: Loving, Supportive  Family History:  Marital status: Long term relationship Long term relationship, how long?: 20yrs What types of issues is patient dealing with in the relationship?: None reported Additional relationship information: N/A Are you sexually active?: No What is your sexual orientation?: Lesbian Has your sexual activity been affected by drugs, alcohol, medication, or emotional stress?: None reported Does patient have children?: Yes How many children?: 1 How is patient's relationship with their children?: Son age 83, "good relationship"  Childhood History:  By whom was/is the patient raised?: Both parents Additional childhood history information: Father has physical health  problems Description of patient's relationship with caregiver when they were a child: "Always been good" Patient's description of current relationship with people who raised him/her: Pt says she is the closest to her father and has "friend relationship than mother/daughter" with her mother How were you disciplined when you got in trouble as a child/adolescent?: "Got two whoopings my whole life" Does patient have siblings?: Yes Number of Siblings: 1 Description of patient's current relationship with siblings: "very close" Did patient suffer any verbal/emotional/physical/sexual abuse as a child?: No Did patient suffer from severe childhood neglect?: No Has patient ever been sexually abused/assaulted/raped as an adolescent or adult?: No Was the patient ever a victim of a crime or a disaster?: No Witnessed domestic violence?: No Has patient been effected by domestic violence as an adult?: No  Education:  Highest grade of school patient has completed: High school diploma Currently a student?: No Learning disability?: No  Employment/Work Situation:   Employment situation: Unemployed(Pt says she works at [employer] but unemployed due to covid 19) Patient's job has been impacted by current illness: Yes Describe how patient's job has been impacted: Pt reports due to drug and alcohol use she would be hung over and call out of work. What is the longest time patient has a held a job?: 78yrs Where was the patient employed at that time?: [place] Did You Receive Any Psychiatric Treatment/Services While in the [employer]?: No Are There Guns or Other Weapons in [place]?: No Are These Psychologist, educational?: (Pt denies access)  Financial Resources:   Financial resources: Physicist, medical, Medicaid Does patient have a representative payee or guardian?: No  Alcohol/Substance Abuse:   What has been your use of drugs/alcohol within the last 12 months?: Started using cocaine  age 66(uses 4x per month), alcohol age 57(daily 1-6 beers, pint liquor)  If attempted suicide, did drugs/alcohol play a role in this?: No Alcohol/Substance Abuse Treatment Hx: Denies past history If yes, describe treatment: N/A Has alcohol/substance abuse ever caused legal problems?: No  Social Support System:   Pensions consultant Support System: Fair Astronomer System: Girlfriend, sister Type of faith/religion: None reported How does patient's faith help to cope with current illness?: None reported  Leisure/Recreation:   Leisure and Hobbies: Exercise, cooking  Strengths/Needs:   What is the patient's perception of their strengths?: "Helping others" Patient states they can use these personal strengths during their treatment to contribute to their recovery: "Think about what's most important" Patient states these barriers may affect/interfere with their treatment: None Patient states these barriers may affect their return to the community: None Other important information patient would like considered in planning for their treatment: N/A  Discharge Plan:   Currently receiving community mental health services: No Patient states concerns and preferences for aftercare planning are: Pt would like to be referred to [facility], want outpatient treatment. No residential treatment requested at this time. Patient states they will know when they are safe and ready for discharge when: "100% know I'm ready to go, just needed time to collect myself" Does patient have access to transportation?: Yes Does patient have financial barriers related to discharge medications?: No Patient description of barriers related to discharge medications: N/A Will patient be returning to same living situation after discharge?: Yes  Summary/Recommendations:   Summary and Recommendations (to be completed by the evaluator): Pt is a 38 yr old female brought to the ED due to increased stress, anxiety and substance  use. Pt has a dx of bipolar 1 disorder. Pt identified the following stressors: several deceased family members, recently loss job due to covid 46, drug and alcohol use. Pt states she started feeling overwhelmed and voluntarily came to the hospital. Pt reports a hx of alcohol and cocaine use that began during her teen years. Pt denies any legal issues as a result of substance abuse. No SA tx history reported. Pt does not have a mental health provider at this time but reports she would like to receive outpatient treatment. Pt in agreement with being referred to [facility] and says she was last seen there 7 years ago. While here, patient will benefit from crisis stabilization, medication evaluation, group therapy and psychoeducation. In addition, it is recommended that patient remain compliant with the established discharge plan and continue treatment.   Lucyann Romano Lynelle Smoke. 02/14/2019

## 2019-02-14 NOTE — BHH Suicide Risk Assessment (Signed)
[facility] Admission Suicide Risk Assessment   Nursing information obtained from:  Patient Demographic factors:  NA Current Mental Status:  NA Loss Factors:  NA Historical Factors:  NA Risk Reduction Factors:  NA  Total Time spent with patient: 1 hour Principal Problem: Substance induced mood disorder ([place]) Diagnosis:  Principal Problem:   Substance induced mood disorder ([place]) Active Problems:   Cocaine abuse ([place])   Alcohol abuse  Subjective Data: Patient seen and chart reviewed.  Patient presented to the emergency room reporting suicidal ideation and psychotic symptoms.  Patient had recently been abusing alcohol and cocaine heavily.  On interview with me today she says that all of the suicidal ideation was "the drugs talking".  She denies any suicidal or homicidal thought.  Denies any current psychotic symptoms.  Says that she feels like her mood is pretty stable other than her drug use.  Patient had been cutting herself very superficially but had not done anything else to try to harm her self  Continued Clinical Symptoms:  Alcohol Use Disorder Identification Test Final Score (AUDIT): 7 The "Alcohol Use Disorders Identification Test", Guidelines for Use in Primary Care, Second Edition.  World Pharmacologist [facility]). Score between 0-7:  no or low risk or alcohol related problems. Score between 8-15:  moderate risk of alcohol related problems. Score between 16-19:  high risk of alcohol related problems. Score 20 or above:  warrants further diagnostic evaluation for alcohol dependence and treatment.   CLINICAL FACTORS:   Alcohol/Substance Abuse/Dependencies   Musculoskeletal: Strength & Muscle Tone: within normal limits Gait & Station: normal Patient leans: N/A  Psychiatric Specialty Exam: Physical Exam  Nursing note and vitals reviewed. Constitutional: She appears well-developed and well-nourished.  HENT:  Head: Normocephalic and atraumatic.  Eyes: Pupils are equal, round, and  reactive to light. Conjunctivae are normal.  Neck: Normal range of motion.  Cardiovascular: Regular rhythm and normal heart sounds.  Respiratory: Effort normal.  GI: Soft.  Musculoskeletal: Normal range of motion.  Neurological: She is alert.  Skin: Skin is warm and dry.  Psychiatric: She has a normal mood and affect. Her behavior is normal. Judgment and thought content normal.    Review of Systems  Constitutional: Negative.   HENT: Negative.   Eyes: Negative.   Respiratory: Negative.   Cardiovascular: Negative.   Gastrointestinal: Negative.   Musculoskeletal: Negative.   Skin: Negative.   Neurological: Negative.   Psychiatric/Behavioral: Positive for depression and substance abuse. Negative for hallucinations and suicidal ideas. The patient is not nervous/anxious.     Blood pressure (!) 142/89, pulse 89, temperature 98.2 F (36.8 C), temperature source Oral, resp. rate 18, height 5' (1.524 m), weight 65.3 kg, SpO2 98 %.Body mass index is 28.12 kg/m.  General Appearance: Casual  Eye Contact:  Good  Speech:  Normal Rate  Volume:  Decreased  Mood:  Euthymic  Affect:  Congruent  Thought Process:  Goal Directed  Orientation:  Full (Time, Place, and Person)  Thought Content:  Logical  Suicidal Thoughts:  No  Homicidal Thoughts:  No  Memory:  Immediate;   Fair Recent;   Poor Remote;   Poor  Judgement:  Impaired  Insight:  Shallow  Psychomotor Activity:  Normal  Concentration:  Concentration: Fair  Recall:  [employer] of Knowledge:  Fair  Language:  Fair  Akathisia:  No  Handed:  Right  AIMS (if indicated):     Assets:  Desire for Improvement Housing Physical Health Resilience  ADL's:  Intact  Cognition:  WNL  Sleep:  Number of Hours: 5.5      COGNITIVE FEATURES THAT CONTRIBUTE TO RISK:  Loss of executive function    SUICIDE RISK:   Minimal: No identifiable suicidal ideation.  Patients presenting with no risk factors but with morbid ruminations; may be  classified as minimal risk based on the severity of the depressive symptoms  PLAN OF CARE: Patient seen and chart reviewed.  Patient came into the hospital intoxicated and had made some superficial cuts to her arm and voiced some suicidal ideation.  She has been completely calm and appropriate here in the unit since coming downstairs.  Patient in interview with me denies any psychotic symptoms.  She is appropriate lucid and articulate.  Denies suicidal or homicidal ideation.  Makes a good case that her symptoms are largely related to drug abuse.  Patient is requesting discharge because she says that she has a job that she is hoping to start within the next day or so.  Continue current medicine for her past history of seizures likely discharge by tomorrow  I certify that inpatient services furnished can reasonably be expected to improve the patient's condition.   Alethia Berthold, MD 02/14/2019, 6:11 PM

## 2019-02-14 NOTE — H&P (Signed)
Psychiatric Admission Assessment Adult  Patient Identification: Ashley Fry MRN:  [id number] Date of Evaluation:  02/14/2019 Chief Complaint:  BIPOLAR 1 DISORDER, MIXED SEVERE Principal Diagnosis: Substance induced mood disorder ([place]) Diagnosis:  Principal Problem:   Substance induced mood disorder ([place]) Active Problems:   Cocaine abuse ([place])   Alcohol abuse  History of Present Illness: Patient seen and chart reviewed.  This is a woman presented to the emergency room intoxicated with cocaine and alcohol.  She said that at the time she was "feeling suicidal".  She had made some scratches to her arms.  The scratches were so superficial they are barely visible at this point.  Patient said that she had been drinking heavily and using a lot of cocaine.  She got to feeling negative and guilty about herself which is what got her depressed and made her come into the hospital.  Patient says when she is not intoxicated her mood is fairly stable.  When in the emergency room she made some suicidal thoughts without specific plan to the emergency room physician.  On interview with me the patient says "that was just the drugs talking".  She is requesting discharge stating that she has no thought or plan of killing herself or doing anything violent.  She says she thinks her main problem is the drug and alcohol abuse.  She had some major stresses recently that her father had had strokes and now has been placed in a nursing home.  She had been out of work recently because of the coronavirus.  He said that on Friday she just "got overwhelmed".  She is not currently taking any psychiatric medicine is near as I can tell. Associated Signs/Symptoms: Depression Symptoms:  depressed mood, (Hypo) Manic Symptoms:  Distractibility, Flight of Ideas, Impulsivity, Anxiety Symptoms:  Not reported Psychotic Symptoms:  Patient says that at times especially when intoxicated she will hear vague sounds in the distance but never  feels like she has any delusions about them.  Does not think they are of any significance. PTSD Symptoms: Negative Total Time spent with patient: 1 hour  Past Psychiatric History: Patient has had previous psychiatric hospitalizations.  She was hospitalized here apparently in 2003.  She has a past history of doing some cutting on herself.  Denies ever making a more serious suicide attempt.  She has been given diagnoses use variously throughout the years of schizophrenia bipolar disorder and PTSD.  Despite this looking back over many of the medical notes in recent years there is no evidence that she was showing psychiatric symptoms and does not appear to have generally been on any psychiatric medicine.  She does not have a life course that would be typical of schizophrenia.  Currently she is not showing symptoms of major depression or mania.  The patient does have a suppose a past history of seizures.  She has seen neurologist in the past.  She is very hard to pin down on how she would describe the seizures and she says that she has not had any in months.  She is apparently not taking any medication for them at this time although she did take Depakote in the past.  Patient says she has managed to stay sober for a couple months at a time in the past.  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  Is the patient at risk to self? Yes.    Has the patient been a risk to self in the past 6 months? Yes.  Has the patient been a risk to self within the distant past? Yes.    Is the patient a risk to others? No.  Has the patient been a risk to others in the past 6 months? No.  Has the patient been a risk to others within the distant past? No.   Prior Inpatient Therapy:   Prior Outpatient Therapy:    Alcohol Screening: 1. How often do you have a drink containing alcohol?: 2 to 3 times a week 2. How many drinks containing alcohol do you have on a typical day when you are drinking?: 5 or 6 3. How often do you have six or more  drinks on one occasion?: Monthly AUDIT-C Score: 7 4. How often during the last year have you found that you were not able to stop drinking once you had started?: Never 5. How often during the last year have you failed to do what was normally expected from you becasue of drinking?: Never 6. How often during the last year have you needed a first drink in the morning to get yourself going after a heavy drinking session?: Never 7. How often during the last year have you had a feeling of guilt of remorse after drinking?: Never 8. How often during the last year have you been unable to remember what happened the night before because you had been drinking?: Never 9. Have you or someone else been injured as a result of your drinking?: No 10. Has a relative or friend or a doctor or another health worker been concerned about your drinking or suggested you cut down?: No Alcohol Use Disorder Identification Test Final Score (AUDIT): 7 Alcohol Brief Interventions/Follow-up: AUDIT Score <7 follow-up not indicated Substance Abuse History in the last 12 months:  Yes.   Consequences of Substance Abuse: Medical Consequences:  Major psychiatric problems with self injury Previous Psychotropic Medications: Yes  Psychological Evaluations: Yes  Past Medical History:  Past Medical History:  Diagnosis Date  . Alcohol abuse   . Asthma   . Bipolar 1 disorder ([place])   . Depression   . Seizures ([place])   . Substance abuse ([place])    History reviewed. No pertinent surgical history. Family History: History reviewed. No pertinent family history. Family Psychiatric  History: She has a cousin with some kind of mental health problem Tobacco Screening: Have you used any form of tobacco in the last 30 days? (Cigarettes, Smokeless Tobacco, Cigars, and/or Pipes): No Social History:  Social History   Substance and Sexual Activity  Alcohol Use Yes   Comment: daily     Social History   Substance and Sexual Activity  Drug Use  Yes  . Types: Cocaine, Marijuana    Additional Social History: Marital status: Long term relationship Long term relationship, how long?: 48yr What types of issues is patient dealing with in the relationship?: None reported Additional relationship information: N/A Are you sexually active?: No What is your sexual orientation?: Lesbian Has your sexual activity been affected by drugs, alcohol, medication, or emotional stress?: None reported Does patient have children?: Yes How many children?: 1 How is patient's relationship with their children?: Son age 37 "good relationship"                         Allergies:  No Known Allergies Lab Results:  Results for orders placed or performed during the hospital encounter of 02/13/19 (from the past 48 hour(s))  Pregnancy, urine     Status: None  Collection Time: 02/14/19 11:50 AM  Result Value Ref Range   Preg Test, Ur NEGATIVE NEGATIVE    Comment: Performed at [facility], [address] [place], [place] [address]    Blood Alcohol level:  Lab Results  Component Value Date   ETH 142 (H) 02/58/5277    Metabolic Disorder Labs:  Lab Results  Component Value Date   HGBA1C 6.0 (H) 02/13/2019   MPG 125.5 02/13/2019   No results found for: PROLACTIN Lab Results  Component Value Date   CHOL 183 02/13/2019   TRIG 89 02/13/2019   HDL 67 02/13/2019   CHOLHDL 2.7 02/13/2019   VLDL 18 02/13/2019   LDLCALC 98 02/13/2019    Current Medications: Current Facility-Administered Medications  Medication Dose Route Frequency Provider Last Rate Last Dose  . acetaminophen (TYLENOL) tablet 650 mg  650 mg Oral Q6H PRN Lavella Hammock, MD      . albuterol (VENTOLIN HFA) 108 (90 Base) MCG/ACT inhaler 2 puff  2 puff Inhalation Q6H PRN Lavella Hammock, MD      . alum & mag hydroxide-simeth (MAALOX/MYLANTA) 200-200-20 MG/5ML suspension 30 mL  30 mL Oral Q4H PRN Lavella Hammock, MD      . divalproex (DEPAKOTE) DR tablet 250 mg  250  mg Oral BID [place] Tygart, Madie Reno, MD   250 mg at 02/14/19 0820  . magnesium hydroxide (MILK OF MAGNESIA) suspension 30 mL  30 mL Oral Daily PRN Lavella Hammock, MD      . mometasone-formoterol [facility]) 200-5 MCG/ACT inhaler 2 puff  2 puff Inhalation BID Lavella Hammock, MD   2 puff at 02/13/19 2124   PTA Medications: Facility-Administered Medications Prior to Admission  Medication Dose Route Frequency Provider Last Rate Last Dose  . levonorgestrel (MIRENA) 20 MCG/24HR IUD   Intrauterine Once Copland, Alicia B, PA-C       Medications Prior to Admission  Medication Sig Dispense Refill Last Dose  . albuterol (PROVENTIL HFA;VENTOLIN HFA) 108 (90 Base) MCG/ACT inhaler Inhale 2 puffs into the lungs every 6 (six) hours as needed for wheezing or shortness of breath. 1 Inhaler 2 prn at prn  . budesonide-formoterol (SYMBICORT) 160-4.5 MCG/ACT inhaler Inhale 2 puffs into the lungs every 12 (twelve) hours. 2 Inhaler 0 prn at prn  . ibuprofen (ADVIL,MOTRIN) 600 MG tablet Take 1 tablet (600 mg total) by mouth every 8 (eight) hours as needed. (Patient not taking: Reported on 02/13/2019) 15 tablet 0 Completed Course at Unknown time    Musculoskeletal: Strength & Muscle Tone: within normal limits Gait & Station: normal Patient leans: N/A  Psychiatric Specialty Exam: Physical Exam  Nursing note and vitals reviewed. Constitutional: She appears well-developed and well-nourished.  HENT:  Head: Normocephalic and atraumatic.  Eyes: Pupils are equal, round, and reactive to light. Conjunctivae are normal.  Neck: Normal range of motion.  Cardiovascular: Regular rhythm and normal heart sounds.  Respiratory: Effort normal. No respiratory distress.  GI: Soft.  Musculoskeletal: Normal range of motion.  Neurological: She is alert.  Skin: Skin is warm and dry.  Psychiatric: She has a normal mood and affect. Her behavior is normal. Judgment and thought content normal.    Review of Systems  Constitutional: Negative.    HENT: Negative.   Eyes: Negative.   Respiratory: Negative.   Cardiovascular: Negative.   Gastrointestinal: Negative.   Musculoskeletal: Negative.   Skin: Negative.   Neurological: Negative.   Psychiatric/Behavioral: Positive for substance abuse. Negative for depression, hallucinations and suicidal ideas.  Blood pressure (!) 142/89, pulse 89, temperature 98.2 F (36.8 C), temperature source Oral, resp. rate 18, height 5' (1.524 m), weight 65.3 kg, SpO2 98 %.Body mass index is 28.12 kg/m.  General Appearance: Casual  Eye Contact:  Fair  Speech:  Clear and Coherent  Volume:  Normal  Mood:  Euthymic  Affect:  Congruent  Thought Process:  Goal Directed  Orientation:  Full (Time, Place, and Person)  Thought Content:  Logical  Suicidal Thoughts:  No  Homicidal Thoughts:  No  Memory:  Immediate;   Fair Recent;   Fair Remote;   Fair  Judgement:  Fair  Insight:  Fair  Psychomotor Activity:  Normal  Concentration:  Concentration: Fair  Recall:  [employer] of Knowledge:  Fair  Language:  Fair  Akathisia:  No  Handed:  Right  AIMS (if indicated):     Assets:  Desire for Improvement Housing Physical Health Resilience Social Support  ADL's:  Intact  Cognition:  WNL  Sleep:  Number of Hours: 5.5    Treatment Plan Summary: Daily contact with patient to assess and evaluate symptoms and progress in treatment, Medication management and Plan I pointed out to the patient that I remained a little confused about her diagnosis.  She does not have a history of presentation that would really be consistent with schizophrenia.  The bipolar disorder seems to have largely been based on mood swings and behaviors that happen when intoxicated as she says that she has been abusing alcohol and cocaine pretty regularly for many years.  This is her opinion of the matter as well.  Despite this she also talks about how medication has been helpful although she has a hard time and articulating exactly in  which way.  She is very focused on wanting to be discharged from the hospital but right now is not showing any acute symptoms does not appear to be acutely dangerous.  She is able to articulate an appropriate plan for self-care in the future and is agreeable to going to [facility].  I had started her on low-dose Depakote on admission because of the past history of seizures.  She is agreeable in principal to continuing on that medicine as she said may have been helpful before her mood swings.  At this point we are likely to discharge her within 1 to 2 days or so as she has already met with representatives from [facility] and made agreements about discharge planning  Observation Level/Precautions:  15 minute checks  Laboratory:  UDS  Psychotherapy:    Medications:    Consultations:    Discharge Concerns:    Estimated LOS:  Other:     Physician Treatment Plan for Primary Diagnosis: Substance induced mood disorder ([place]) Long Term Goal(s): Improvement in symptoms so as ready for discharge  Short Term Goals: Ability to verbalize feelings will improve and Ability to disclose and discuss suicidal ideas  Physician Treatment Plan for Secondary Diagnosis: Principal Problem:   Substance induced mood disorder ([place]) Active Problems:   Cocaine abuse ([place])   Alcohol abuse  Long Term Goal(s): Improvement in symptoms so as ready for discharge  Short Term Goals: Ability to identify triggers associated with substance abuse/mental health issues will improve  I certify that inpatient services furnished can reasonably be expected to improve the patient's condition.    Alethia Berthold, MD 6/2/20206:14 PM

## 2019-02-15 MED ORDER — DIVALPROEX SODIUM 250 MG PO DR TAB: 250.0000 mg | DELAYED_RELEASE_TABLET | Freq: Two times a day (BID) | ORAL | 1 refills | Status: DC

## 2019-02-15 NOTE — Discharge Summary (Signed)
Physician Discharge Summary Note  Patient:  Ashley Fry is an 38 y.o., female MRN:  [id number] DOB:  01-13-1981 Patient phone:  [phone] (home)  Patient address:   [place] [address],  Total Time spent with patient: 1 hour  Date of Admission:  02/13/2019 Date of Discharge: February 15, 2019  Reason for Admission: Patient admitted through the emergency room after presenting with agitation depressed mood and statements of some suicidality as well as superficial scratches to the arm  Principal Problem: Substance induced mood disorder [facility]) Discharge Diagnoses: Principal Problem:   Substance induced mood disorder ([place]) Active Problems:   Cocaine abuse ([place])   Alcohol abuse   Past Psychiatric History: Patient has a history of substance abuse and some mood instability largely associated with substance abuse  Past Medical History:  Past Medical History:  Diagnosis Date  . Alcohol abuse   . Asthma   . Bipolar 1 disorder ([place])   . Depression   . Seizures ([place])   . Substance abuse ([place])    History reviewed. No pertinent surgical history. Family History: History reviewed. No pertinent family history. Family Psychiatric  History: None reported Social History:  Social History   Substance and Sexual Activity  Alcohol Use Yes   Comment: daily     Social History   Substance and Sexual Activity  Drug Use Yes  . Types: Cocaine, Marijuana    Social History   Socioeconomic History  . Marital status: Single    Spouse name: Not on file  . Number of children: Not on file  . Years of education: Not on file  . Highest education level: Not on file  Occupational History  . Not on file  Social Needs  . Financial resource strain: Not on file  . Food insecurity:    Worry: Not on file    Inability: Not on file  . Transportation needs:    Medical: Not on file    Non-medical: Not on file  Tobacco Use  . Smoking status: Current Every Day Smoker     Packs/day: 0.50    Types: Cigarettes  . Smokeless tobacco: Never Used  Substance and Sexual Activity  . Alcohol use: Yes    Comment: daily  . Drug use: Yes    Types: Cocaine, Marijuana  . Sexual activity: Yes    Birth control/protection: I.U.D.    Comment: Mirena   Lifestyle  . Physical activity:    Days per week: Not on file    Minutes per session: Not on file  . Stress: Not on file  Relationships  . Social connections:    Talks on phone: Not on file    Gets together: Not on file    Attends religious service: Not on file    Active member of club or organization: Not on file    Attends meetings of clubs or organizations: Not on file    Relationship status: Not on file  Other Topics Concern  . Not on file  Social History Narrative  . Not on file    Hospital Course: Patient was put on 15-minute checks.  Did not show any dangerous behavior in the hospital.  She was cooperative with treatment.  Showed no major medical signs.  On interview the patient said she thought her main problem was her cocaine abuse and alcohol abuse which caused her to become agitated and tearful.  While sober she completely denied any suicidal ideation.  She was very focused on  a new job she was planning to start.  No evidence of psychosis.  She was started on low-dose Depakote because of her past history of seizures and was discharged on this as well.  Follow-up to be arranged at [facility].  She is very agreeable to this plan.  Physical Findings: AIMS:  , ,  ,  ,    CIWA:  CIWA-Ar Total: 0 COWS:     Musculoskeletal: Strength & Muscle Tone: within normal limits Gait & Station: normal Patient leans: N/A  Psychiatric Specialty Exam: Physical Exam  Constitutional: She appears well-developed and well-nourished.  HENT:  Head: Normocephalic and atraumatic.  Eyes: Pupils are equal, round, and reactive to light. Conjunctivae are normal.  Neck: Normal range of motion.  Cardiovascular: Normal heart sounds.   Respiratory: Effort normal.  GI: Soft.  Musculoskeletal: Normal range of motion.  Neurological: She is alert.  Skin: Skin is warm and dry.  Psychiatric: She has a normal mood and affect. Her behavior is normal. Judgment and thought content normal.    Review of Systems  Constitutional: Negative.   HENT: Negative.   Eyes: Negative.   Respiratory: Negative.   Cardiovascular: Negative.   Gastrointestinal: Negative.   Musculoskeletal: Negative.   Skin: Negative.   Neurological: Negative.   Psychiatric/Behavioral: Negative.     Blood pressure 119/83, pulse 92, temperature 98.2 F (36.8 C), temperature source Oral, resp. rate 17, height 5' (1.524 m), weight 65.3 kg, SpO2 100 %.Body mass index is 28.12 kg/m.  General Appearance: Casual  Eye Contact:  Fair  Speech:  Clear and Coherent  Volume:  Normal  Mood:  Euthymic  Affect:  Constricted  Thought Process:  Goal Directed  Orientation:  Full (Time, Place, and Person)  Thought Content:  Logical  Suicidal Thoughts:  No  Homicidal Thoughts:  No  Memory:  Immediate;   Fair Recent;   Fair Remote;   Fair  Judgement:  Fair  Insight:  Fair  Psychomotor Activity:  Normal  Concentration:  Concentration: Fair  Recall:  [place] of Knowledge:  Fair  Language:  Fair  Akathisia:  No  Handed:  Right  AIMS (if indicated):     Assets:  Desire for Improvement Housing Physical Health Resilience  ADL's:  Intact  Cognition:  WNL  Sleep:  Number of Hours: 7.5     Have you used any form of tobacco in the last 30 days? (Cigarettes, Smokeless Tobacco, Cigars, and/or Pipes): No  Has this patient used any form of tobacco in the last 30 days? (Cigarettes, Smokeless Tobacco, Cigars, and/or Pipes) Yes, Yes, A prescription for an FDA-approved tobacco cessation medication was offered at discharge and the patient refused  Blood Alcohol level:  Lab Results  Component Value Date   ETH 142 (H) 93/81/0175    Metabolic Disorder Labs:  Lab  Results  Component Value Date   HGBA1C 6.0 (H) 02/13/2019   MPG 125.5 02/13/2019   No results found for: PROLACTIN Lab Results  Component Value Date   CHOL 183 02/13/2019   TRIG 89 02/13/2019   HDL 67 02/13/2019   CHOLHDL 2.7 02/13/2019   VLDL 18 02/13/2019   [place] 98 02/13/2019    See Psychiatric Specialty Exam and Suicide Risk Assessment completed by Attending Physician prior to discharge.  Discharge destination:  Home  Is patient on multiple antipsychotic therapies at discharge:  No   Has Patient had three or more failed trials of antipsychotic monotherapy by history:  No  Recommended Plan for  Multiple Antipsychotic Therapies: NA  Discharge Instructions    Diet - low sodium heart healthy   Complete by:  As directed    Increase activity slowly   Complete by:  As directed      Allergies as of 02/15/2019   No Known Allergies     Medication List    STOP taking these medications   ibuprofen 600 MG tablet Commonly known as:  ADVIL     TAKE these medications     Indication  albuterol 108 (90 Base) MCG/ACT inhaler Commonly known as:  VENTOLIN HFA Inhale 2 puffs into the lungs every 6 (six) hours as needed for wheezing or shortness of breath.  Indication:  Asthma   budesonide-formoterol 160-4.5 MCG/ACT inhaler Commonly known as:  Symbicort Inhale 2 puffs into the lungs every 12 (twelve) hours.  Indication:  Asthma   divalproex 250 MG DR tablet Commonly known as:  DEPAKOTE Take 1 tablet (250 mg total) by mouth 2 (two) times daily.  Indication:  Complex Absence Seizures, Depressive Phase of Manic-Depression      Follow-up Information    [facility] Follow up on 02/20/2019.   Why:  Please follow up at [facility] on Monday, June 8th at 8am. Please have your hospital discharge paperwork available. Thank you. Contact information: [place] [address] [phone]           Follow-up recommendations:  Activity:  Activity as  tolerated Diet:  Regular diet Other:  Follow-up at [facility]  Comments: Patient is agreeable to plan.  She is calm and lucid with no signs of psychosis or mood instability.  Prescription given for the Depakote and she will follow-up with [facility] and with her regular medical provider  Signed: Alethia Berthold, MD 02/15/2019, 5:55 PM

## 2019-02-15 NOTE — Progress Notes (Signed)
Recreation Therapy Notes    Date: 02/15/2019  Time: 9:30 am  Location: Craft room  Behavioral response: Appropriate    Intervention Topic: Wellness  Discussion/Intervention:  Group content on today was focused on anger management. The group defined anger and reasons they become angry. Individuals expressed negative way they have dealt with anger in the past. Patients stated some positive ways they could deal with anger in the future. The group described how anger can affect your health and daily plans. Individuals participated in the intervention "Score your anger" where they had a chance to answer questions about themselves and get a score of their anger.   Clinical Observations/Feedback:  Patient came to group and explained that wellness is becoming well; which means staying focused mentally and physically. Individual was social with peers and staff while participating in group.   Ashley Fry LRT/CTRS          Ashley Fry 02/15/2019 11:12 AM

## 2019-02-15 NOTE — Progress Notes (Signed)
Patient ID: Ashley Fry, female   DOB: [birth year], 38 y.o.   MRN: [id number]   Discharge Note:  Patient denies SI/HI/AVH at this time. Discharge instructions, AVS, prescriptions, and transition record gone over with patient. Patient agrees to comply with medication management, follow-up visit, and outpatient therapy. Patient belongings returned to patient. Patient questions and concerns addressed and answered. Patient ambulatory off unit. Patient discharged to home with girlfriend.

## 2019-02-15 NOTE — Plan of Care (Signed)
Patient said that she thinks she is getting out of here tomorrow. Patient has a job interview Thursday and she doesn't want to jeopardize it. Patient said she was glad she came in when she did and that it has made her feel a lot better.   Problem: Education: Goal: Mental status will improve Outcome: Progressing

## 2019-02-15 NOTE — BHH Suicide Risk Assessment (Signed)
[facility] Discharge Suicide Risk Assessment   Principal Problem: Substance induced mood disorder ([place]) Discharge Diagnoses: Principal Problem:   Substance induced mood disorder ([place]) Active Problems:   Cocaine abuse ([place])   Alcohol abuse   Total Time spent with patient: 45 minutes  Musculoskeletal: Strength & Muscle Tone: within normal limits Gait & Station: normal Patient leans: N/A  Psychiatric Specialty Exam: Review of Systems  Constitutional: Negative.   HENT: Negative.   Eyes: Negative.   Respiratory: Negative.   Cardiovascular: Negative.   Gastrointestinal: Negative.   Musculoskeletal: Negative.   Skin: Negative.   Neurological: Negative.   Psychiatric/Behavioral: Positive for substance abuse. Negative for depression, hallucinations and suicidal ideas. The patient is not nervous/anxious.     Blood pressure 119/83, pulse 92, temperature 98.2 F (36.8 C), temperature source Oral, resp. rate 17, height 5' (1.524 m), weight 65.3 kg, SpO2 100 %.Body mass index is 28.12 kg/m.  General Appearance: Casual  Eye Contact::  Good  Speech:  Normal Rate409  Volume:  Normal  Mood:  Euthymic  Affect:  Constricted  Thought Process:  Coherent  Orientation:  Full (Time, Place, and Person)  Thought Content:  Logical  Suicidal Thoughts:  No  Homicidal Thoughts:  No  Memory:  Immediate;   Fair Recent;   Fair Remote;   Fair  Judgement:  Fair  Insight:  Fair  Psychomotor Activity:  Normal  Concentration:  Fair  Recall:  [employer] [place]  Language: Fair  Akathisia:  No  Handed:  Right  AIMS (if indicated):     Assets:  Desire for Improvement Financial Resources/Insurance Housing Physical Health Resilience Social Support  Sleep:  Number of Hours: 7.5  Cognition: WNL  ADL's:  Intact   Mental Status Per Nursing Assessment::   On Admission:  NA  Demographic Factors:  Gay, lesbian, or bisexual orientation  Loss Factors: Financial problems/change in  socioeconomic status  Historical Factors: Impulsivity  Risk Reduction Factors:   Responsible for children under 39 years of age, Sense of responsibility to family, Employed, Living with another person, especially a relative and Positive social support  Continued Clinical Symptoms:  Bipolar Disorder:   Mixed State Alcohol/Substance Abuse/Dependencies  Cognitive Features That Contribute To Risk:  Loss of executive function    Suicide Risk:  Minimal: No identifiable suicidal ideation.  Patients presenting with no risk factors but with morbid ruminations; may be classified as minimal risk based on the severity of the depressive symptoms  Follow-up Information    [facility] Follow up on 02/20/2019.   Why:  Please follow up at [facility] on Monday June 8 at 8am.  Contact information: [place] [address] [phone]           Plan Of Care/Follow-up recommendations:  Activity:  Activity as tolerated Diet:  Regular diet Other:  Continue current medicine and follow-up with [facility] for mental health and substance abuse services.  Patient is completely denying any suicidal ideation has been calm and not dangerous has a very positive plans for the future shows good insight.  Alethia Berthold, MD 02/15/2019, 9:07 AM

## 2019-02-15 NOTE — BHH Suicide Risk Assessment (Signed)
[place] INPATIENT:  Family/Significant Other Suicide Prevention Education  Suicide Prevention Education:  Education Completed; Ashley Fry, girlfriend [phone] has been identified by the patient as the family member/significant other with whom the patient will be residing, and identified as the person(s) who will aid the patient in the event of a mental health crisis (suicidal ideations/suicide attempt).  With written consent from the patient, the family member/significant other has been provided the following suicide prevention education, prior to the and/or following the discharge of the patient.  The suicide prevention education provided includes the following:  Suicide risk factors  Suicide prevention and interventions  National Suicide Hotline telephone number  [facility] assessment telephone number  [facility] Emergency Assistance [place] and/or Residential Mobile Crisis Unit telephone number  Request made of family/significant other to:  Remove weapons (e.g., guns, rifles, knives), all items previously/currently identified as safety concern.    Remove drugs/medications (over-the-counter, prescriptions, illicit drugs), all items previously/currently identified as a safety concern.  The family member/significant other verbalizes understanding of the suicide prevention education information provided.  The family member/significant other agrees to remove the items of safety concern listed above. Ms. Ashley Fry reports there are no reservations about the pt returning to the home. She denies the pt having access to guns or weapons. No safety concerns reported at this time.  Ashley Fry T Ashley Fry 02/15/2019, 9:12 AM

## 2019-02-15 NOTE — Progress Notes (Signed)
D - Patient was in the day room upon arrival to the unit. Patient denies SI/HI/AVH and pain. Patient stated, "My anxiety and depression have gotten since I got here. I am glad that I came here and I know I am getting the help I need. I have a job interview coming up and I don't want to jeopardize it with my drinking."   A - Patient was compliant with medication administration per MD orders and procedures on the unit. Patient given education and oriented to the unit and her room. Patient given support and encouragement to be active in her treatment plan. Patient informed to let staff know if there are any issues or problems on the unit.   R - Patient being monitored Q 15 minutes for safety per unit protocol. Patient remains safe on the unit.

## 2019-02-15 NOTE — Progress Notes (Signed)
  [facility] Adult Case Management Discharge Plan :  Will you be returning to the same living situation after discharge:  Yes,  lives with significant other At discharge, do you have transportation home?: Yes,  significant other will pick up at 11am Do you have the ability to pay for your medications: Yes,  insurance  Release of information consent forms completed and in the chart;  Patient's signature needed at discharge.  Patient to Follow up at: Follow-up Information    [facility] Follow up on 02/20/2019.   Why:  Please follow up at [facility] on Monday, June 8th at 8am. Please have your hospital discharge paperwork available. Thank you. Contact information: [place] [address] [phone]           Next level of care provider has access to [place] and Suicide Prevention discussed: Yes,  Sinclair Grooms, significant other  Have you used any form of tobacco in the last 30 days? (Cigarettes, Smokeless Tobacco, Cigars, and/or Pipes): No  Has patient been referred to the Quitline?: N/A patient is not a smoker  Patient has been referred for addiction treatment: N/A  Yvette Rack, LCSW 02/15/2019, 9:17 AM

## 2019-02-15 NOTE — BHH Counselor (Signed)
CSW provided pt with a list of local AA/NA support groups, per pt request.

## 2019-06-09 ENCOUNTER — Other Ambulatory Visit: Payer: Self-pay

## 2019-07-13 ENCOUNTER — Emergency Department
Admission: EM | Admit: 2019-07-13 | Discharge: 2019-07-13 | Disposition: A | Discharge status: Home / Self Care | Payer: Medicaid Other | Attending: Emergency Medicine | Admitting: Emergency Medicine

## 2019-07-13 ENCOUNTER — Other Ambulatory Visit: Payer: Self-pay

## 2019-07-13 ENCOUNTER — Encounter: Payer: Self-pay | Admitting: Intensive Care

## 2019-07-13 ENCOUNTER — Emergency Department: Payer: Medicaid Other

## 2019-07-13 DIAGNOSIS — K2921 Alcoholic gastritis with bleeding: Secondary | ICD-10-CM | POA: Diagnosis not present

## 2019-07-13 DIAGNOSIS — J45909 Unspecified asthma, uncomplicated: Secondary | ICD-10-CM | POA: Insufficient documentation

## 2019-07-13 DIAGNOSIS — Z3202 Encounter for pregnancy test, result negative: Secondary | ICD-10-CM | POA: Diagnosis not present

## 2019-07-13 DIAGNOSIS — K529 Noninfective gastroenteritis and colitis, unspecified: Secondary | ICD-10-CM | POA: Diagnosis not present

## 2019-07-13 DIAGNOSIS — Z87891 Personal history of nicotine dependence: Secondary | ICD-10-CM | POA: Diagnosis not present

## 2019-07-13 DIAGNOSIS — D259 Leiomyoma of uterus, unspecified: Secondary | ICD-10-CM | POA: Diagnosis not present

## 2019-07-13 DIAGNOSIS — Z79899 Other long term (current) drug therapy: Secondary | ICD-10-CM | POA: Diagnosis not present

## 2019-07-13 DIAGNOSIS — K920 Hematemesis: Secondary | ICD-10-CM

## 2019-07-13 DIAGNOSIS — R1031 Right lower quadrant pain: Secondary | ICD-10-CM | POA: Diagnosis present

## 2019-07-13 LAB — URINALYSIS, COMPLETE (UACMP) WITH MICROSCOPIC
Bilirubin Urine: NEGATIVE
Glucose, UA: NEGATIVE mg/dL
Hgb urine dipstick: NEGATIVE
Ketones, ur: NEGATIVE mg/dL
Nitrite: NEGATIVE
Protein, ur: NEGATIVE mg/dL
Specific Gravity, Urine: 1.016 (ref 1.005–1.030)
pH: 6 (ref 5.0–8.0)

## 2019-07-13 LAB — COMPREHENSIVE METABOLIC PANEL
ALT: 12 U/L (ref 0–44)
AST: 17 U/L (ref 15–41)
Albumin: 4.3 g/dL (ref 3.5–5.0)
Alkaline Phosphatase: 52 U/L (ref 38–126)
Anion gap: 9 (ref 5–15)
BUN: 15 mg/dL (ref 6–20)
CO2: 26 mmol/L (ref 22–32)
Calcium: 10 mg/dL (ref 8.9–10.3)
Chloride: 103 mmol/L (ref 98–111)
Creatinine, Ser: 0.89 mg/dL (ref 0.44–1.00)
GFR calc Af Amer: >60 mL/min (ref >60)
GFR calc non Af Amer: >60 mL/min (ref >60)
Glucose, Bld: 95 mg/dL (ref 70–99)
Potassium: 3.8 mmol/L (ref 3.5–5.1)
Sodium: 138 mmol/L (ref 135–145)
Total Bilirubin: 0.5 mg/dL (ref 0.3–1.2)
Total Protein: 7.7 g/dL (ref 6.5–8.1)

## 2019-07-13 LAB — CBC
HCT: 36.2 % (ref 36.0–46.0)
Hemoglobin: 11.7 g/dL — ABNORMAL LOW (ref 12.0–15.0)
MCH: 24.4 pg — ABNORMAL LOW (ref 26.0–34.0)
MCHC: 32.3 g/dL (ref 30.0–36.0)
MCV: 75.4 fL — ABNORMAL LOW (ref 80.0–100.0)
Platelets: 267 10*3/uL (ref 150–400)
RBC: 4.8 MIL/uL (ref 3.87–5.11)
RDW: 14.9 % (ref 11.5–15.5)
WBC: 8.8 10*3/uL (ref 4.0–10.5)
nRBC: 0 % (ref 0.0–0.2)

## 2019-07-13 LAB — LIPASE, BLOOD: Lipase: 18 U/L (ref 11–51)

## 2019-07-13 LAB — HEMOGLOBIN AND HEMATOCRIT, BLOOD
HCT: 33.9 % — ABNORMAL LOW (ref 36.0–46.0)
Hemoglobin: 11 g/dL — ABNORMAL LOW (ref 12.0–15.0)

## 2019-07-13 LAB — POCT PREGNANCY, URINE: Preg Test, Ur: NEGATIVE

## 2019-07-13 MED ORDER — MORPHINE SULFATE (PF) 4 MG/ML IV SOLN
4.0000 mg | Freq: Once | INTRAVENOUS | Status: AC
  Administered 2019-07-13: 4 mg via INTRAVENOUS
  Filled 2019-07-13: qty 1

## 2019-07-13 MED ORDER — IOHEXOL 300 MG/ML  SOLN
100.0000 mL | Freq: Once | INTRAMUSCULAR | Status: AC | PRN
  Administered 2019-07-13: 100 mL via INTRAVENOUS
  Filled 2019-07-13: qty 100

## 2019-07-13 MED ORDER — ONDANSETRON HCL 4 MG/2ML IJ SOLN
4.0000 mg | Freq: Once | INTRAMUSCULAR | Status: AC
  Administered 2019-07-13: 4 mg via INTRAVENOUS
  Filled 2019-07-13: qty 2

## 2019-07-13 MED ORDER — ONDANSETRON 4 MG PO TBDP: 4.0000 mg | ORAL_TABLET | Freq: Three times a day (TID) | ORAL | 0 refills | Status: DC | PRN

## 2019-07-13 MED ORDER — LACTATED RINGERS IV BOLUS
1000.0000 mL | Freq: Once | INTRAVENOUS | Status: AC
  Administered 2019-07-13: 1000 mL via INTRAVENOUS

## 2019-07-13 NOTE — ED Notes (Signed)
PT given warm blanket.

## 2019-07-13 NOTE — ED Triage Notes (Signed)
Patient c/o pelvic cramping with emesis since last night. Also c/o seeing blood in her vomit

## 2019-07-13 NOTE — ED Notes (Signed)
Iv dced.  D/c inst to pt.  Pt signed esignature.

## 2019-07-13 NOTE — ED Provider Notes (Signed)
[facility] Emergency Department Provider Note   ____________________________________________   First MD Initiated Contact with Patient 07/13/19 1704     (approximate)  I have reviewed the triage vital signs and the nursing notes.   HISTORY  Chief Complaint Abdominal Pain and Hematemesis    HPI Ashley Fry is a 38 y.o. female with past medical history of bipolar disorder, substance abuse, and alcohol abuse who presents to the ED complaining of abdominal pain and hematemesis.  Patient reports that last night she developed lower abdominal crampy pain that seems to affect her right lower quadrant greater than her left.  Earlier today she became nauseous and vomited multiple times.  She did not initially noticed blood in her emesis, but subsequently noticed some streaks of blood as well as 1 larger clot.  She has not had any further emesis since 10:00 this morning and denies any blood in her stool or melena.  She does report ongoing right lower quadrant abdominal pain as well as nausea.  She has not had any fevers and denies any dysuria, hematuria, or flank pain.  She admits to drinking about a shot and 2 beers daily, also had been using BC powder about 3 times daily 2 months ago, but recently cut back on this.        Past Medical History:  Diagnosis Date  . Alcohol abuse   . Asthma   . Bipolar 1 disorder ([place])   . Depression   . Seizures ([place])   . Substance abuse [facility])     Patient Active Problem List   Diagnosis Date Noted  . Cocaine abuse ([place]) 02/14/2019  . Alcohol abuse 02/14/2019  . Substance induced mood disorder ([place]) 02/14/2019  . De Quervain's tenosynovitis, right 06/09/2017  . Multiple sclerosis ([place]) 05/20/2017  . Headache, chronic daily 05/19/2017  . Numbness and tingling 05/19/2017  . Chronic motor tic 11/21/2014  . Headache, unspecified headache type 11/21/2014  . Seizures ([place]) 11/21/2014  . Sleep disorder 11/21/2014  . Abortion  10/24/2013  . Contraception 10/24/2013    History reviewed. No pertinent surgical history.  Prior to Admission medications   Medication Sig Start Date End Date Taking? Authorizing Provider  albuterol (PROVENTIL HFA;VENTOLIN HFA) 108 (90 Base) MCG/ACT inhaler Inhale 2 puffs into the lungs every 6 (six) hours as needed for wheezing or shortness of breath. 09/13/18   Merlyn Lot, MD  budesonide-formoterol [facility]) 160-4.5 MCG/ACT inhaler Inhale 2 puffs into the lungs every 12 (twelve) hours. 11/21/18   Flora Lipps, MD  divalproex (DEPAKOTE) 250 MG DR tablet Take 1 tablet (250 mg total) by mouth 2 (two) times daily. 02/15/19   Clapacs, Madie Reno, MD  ondansetron (ZOFRAN ODT) 4 MG disintegrating tablet Take 1 tablet (4 mg total) by mouth every 8 (eight) hours as needed for nausea or vomiting. 07/13/19   Blake Divine, MD    Allergies Patient has no known allergies.  History reviewed. No pertinent family history.  Social History Social History   Tobacco Use  . Smoking status: Former Smoker    Packs/day: 0.50    Types: Cigarettes  . Smokeless tobacco: Never Used  Substance Use Topics  . Alcohol use: Yes    Alcohol/week: 7.0 standard drinks    Types: 7 Cans of beer per week    Comment: daily  . Drug use: Yes    Types: Cocaine, Marijuana    Review of Systems  Constitutional: No fever/chills Eyes: No visual changes. ENT: No sore throat.  Cardiovascular: Denies chest pain. Respiratory: Denies shortness of breath. Gastrointestinal: Positive for abdominal pain, nausea, and vomiting.  No diarrhea.  No constipation. Genitourinary: Negative for dysuria. Musculoskeletal: Negative for back pain. Skin: Negative for rash. Neurological: Negative for headaches, focal weakness or numbness.  ____________________________________________   PHYSICAL EXAM:  VITAL SIGNS: ED Triage Vitals  Enc Vitals Group     BP 07/13/19 1603 126/85     Pulse Rate 07/13/19 1603 74     Resp 07/13/19  1603 16     Temp 07/13/19 1603 98.9 F (37.2 C)     Temp Source 07/13/19 1603 Oral     SpO2 07/13/19 1603 97 %     Weight 07/13/19 1601 150 lb (68 kg)     Height 07/13/19 1601 5' (1.524 m)     Head Circumference --      Peak Flow --      Pain Score 07/13/19 1600 10     Pain Loc --      Pain Edu? --      Excl. in [place]? --     Constitutional: Alert and oriented. Eyes: Conjunctivae are normal. Head: Atraumatic. Nose: No congestion/rhinnorhea. Mouth/Throat: Mucous membranes are moist. Neck: Normal ROM Cardiovascular: Normal rate, regular rhythm. Grossly normal heart sounds. Respiratory: Normal respiratory effort.  No retractions. Lungs CTAB. Gastrointestinal: Soft and tender to palpation in the right lower quadrant with no rebound or guarding. No distention. Genitourinary: deferred Musculoskeletal: No lower extremity tenderness nor edema. Neurologic:  Normal speech and language. No gross focal neurologic deficits are appreciated. Skin:  Skin is warm, dry and intact. No rash noted. Psychiatric: Mood and affect are normal. Speech and behavior are normal.  ____________________________________________   LABS (all labs ordered are listed, but only abnormal results are displayed)  Labs Reviewed  CBC - Abnormal; Notable for the following components:      Result Value   Hemoglobin 11.7 (*)    MCV 75.4 (*)    MCH 24.4 (*)    All other components within normal limits  URINALYSIS, COMPLETE (UACMP) WITH MICROSCOPIC - Abnormal; Notable for the following components:   Color, Urine YELLOW (*)    APPearance HAZY (*)    Leukocytes,Ua SMALL (*)    Bacteria, UA RARE (*)    All other components within normal limits  HEMOGLOBIN AND HEMATOCRIT, BLOOD - Abnormal; Notable for the following components:   Hemoglobin 11.0 (*)    HCT 33.9 (*)    All other components within normal limits  LIPASE, BLOOD  COMPREHENSIVE METABOLIC PANEL  POC URINE PREG, ED  POCT PREGNANCY, URINE     PROCEDURES   Procedure(s) performed (including Critical Care):  Procedures   ____________________________________________   INITIAL IMPRESSION / ASSESSMENT AND PLAN / ED COURSE       38 year old female with history of bipolar disorder and alcohol abuse presents to the ED complaining of abdominal pain as well as hematemesis.  She has tenderness in her right lower quadrant on exam, will obtain CT to rule out appendicitis.  She has minimal upper abdominal tenderness, however her history of alcohol abuse as well as NSAID use is concerning for peptic ulcer disease.  While she had some bleeding earlier with her vomit, she has not had any further vomiting since 10 AM and denies any blood in her stool.  Initial H&H is stable, will repeat 2 hours out and if this remains stable doubt significant GI bleeding.  She has no liver disease or history of varices.  Urine pregnancy testing is negative and UA unremarkable.  Repeat H&H is stable, patient with no further vomiting here in the ED, doubt significant GI bleeding.  CT is significant for mild colitis as well as uterine fibroid, both potential etiologies for her pain.  Colitis is likely viral in etiology, will prescribe Zofran as needed for nausea and vomiting.  Patient counseled to follow-up with OB/GYN regarding her fibroid.  Also counseled patient to follow-up with GI, discontinue alcohol abuse and heavy NSAID use.  Constipation to return to the ED for new or worsening symptoms, patient agrees with plan.      ____________________________________________   FINAL CLINICAL IMPRESSION(S) / ED DIAGNOSES  Final diagnoses:  Hematemesis with nausea  Chronic alcoholic gastritis with hemorrhage  Colitis  Uterine leiomyoma, unspecified location     ED Discharge Orders         Ordered    ondansetron (ZOFRAN ODT) 4 MG disintegrating tablet  Every 8 hours PRN     07/13/19 1932           Note:  This document was prepared using Dragon voice recognition software  and may include unintentional dictation errors.   Blake Divine, MD 07/13/19 2120

## 2019-07-14 ENCOUNTER — Telehealth: Payer: Self-pay | Admitting: Gastroenterology

## 2019-07-14 NOTE — Telephone Encounter (Signed)
No vm to offer apt with Dr. Marius Ditch for ED f/u

## 2019-07-20 ENCOUNTER — Telehealth: Payer: Self-pay | Admitting: Gastroenterology

## 2019-07-20 NOTE — Telephone Encounter (Signed)
No vm to offer apt for ed f/u with Dr. Marius Ditch

## 2019-08-02 ENCOUNTER — Encounter: Payer: Self-pay | Admitting: Gastroenterology

## 2019-09-06 ENCOUNTER — Ambulatory Visit: Payer: Medicaid Other | Attending: Internal Medicine

## 2019-09-06 DIAGNOSIS — Z20822 Contact with and (suspected) exposure to covid-19: Secondary | ICD-10-CM

## 2019-09-07 LAB — NOVEL CORONAVIRUS, NAA: SARS-CoV-2, NAA: NOT DETECTED

## 2020-05-03 ENCOUNTER — Other Ambulatory Visit: Payer: Self-pay

## 2020-05-03 ENCOUNTER — Emergency Department: Payer: Medicaid Other

## 2020-05-03 ENCOUNTER — Emergency Department
Admission: EM | Admit: 2020-05-03 | Discharge: 2020-05-03 | Disposition: A | Discharge status: Home / Self Care | Payer: Medicaid Other | Attending: Emergency Medicine | Admitting: Emergency Medicine

## 2020-05-03 ENCOUNTER — Emergency Department
Admission: EM | Admit: 2020-05-03 | Discharge: 2020-05-03 | Discharge status: Against Medical Advice | Payer: Medicaid Other

## 2020-05-03 ENCOUNTER — Encounter: Payer: Self-pay | Admitting: Emergency Medicine

## 2020-05-03 DIAGNOSIS — J069 Acute upper respiratory infection, unspecified: Secondary | ICD-10-CM | POA: Diagnosis not present

## 2020-05-03 DIAGNOSIS — J988 Other specified respiratory disorders: Secondary | ICD-10-CM

## 2020-05-03 DIAGNOSIS — J45909 Unspecified asthma, uncomplicated: Secondary | ICD-10-CM | POA: Diagnosis not present

## 2020-05-03 DIAGNOSIS — J398 Other specified diseases of upper respiratory tract: Secondary | ICD-10-CM

## 2020-05-03 DIAGNOSIS — F17210 Nicotine dependence, cigarettes, uncomplicated: Secondary | ICD-10-CM | POA: Insufficient documentation

## 2020-05-03 DIAGNOSIS — R0602 Shortness of breath: Secondary | ICD-10-CM | POA: Diagnosis present

## 2020-05-03 LAB — BASIC METABOLIC PANEL
Anion gap: 11 (ref 5–15)
BUN: 10 mg/dL (ref 6–20)
CO2: 24 mmol/L (ref 22–32)
Calcium: 8.8 mg/dL — ABNORMAL LOW (ref 8.9–10.3)
Chloride: 101 mmol/L (ref 98–111)
Creatinine, Ser: 1.13 mg/dL — ABNORMAL HIGH (ref 0.44–1.00)
GFR calc Af Amer: >60 mL/min (ref >60)
GFR calc non Af Amer: >60 mL/min (ref >60)
Glucose, Bld: 128 mg/dL — ABNORMAL HIGH (ref 70–99)
Potassium: 3.4 mmol/L — ABNORMAL LOW (ref 3.5–5.1)
Sodium: 136 mmol/L (ref 135–145)

## 2020-05-03 LAB — CBC WITH DIFFERENTIAL/PLATELET
Abs Immature Granulocytes: 0.03 10*3/uL (ref 0.00–0.07)
Basophils Absolute: 0.1 10*3/uL (ref 0.0–0.1)
Basophils Relative: 1 %
Eosinophils Absolute: 0.5 10*3/uL (ref 0.0–0.5)
Eosinophils Relative: 5 %
HCT: 34.6 % — ABNORMAL LOW (ref 36.0–46.0)
Hemoglobin: 11.6 g/dL — ABNORMAL LOW (ref 12.0–15.0)
Immature Granulocytes: 0 %
Lymphocytes Relative: 31 %
Lymphs Abs: 3.1 10*3/uL (ref 0.7–4.0)
MCH: 25.2 pg — ABNORMAL LOW (ref 26.0–34.0)
MCHC: 33.5 g/dL (ref 30.0–36.0)
MCV: 75.1 fL — ABNORMAL LOW (ref 80.0–100.0)
Monocytes Absolute: 0.7 10*3/uL (ref 0.1–1.0)
Monocytes Relative: 7 %
Neutro Abs: 5.6 10*3/uL (ref 1.7–7.7)
Neutrophils Relative %: 56 %
Platelets: 272 10*3/uL (ref 150–400)
RBC: 4.61 MIL/uL (ref 3.87–5.11)
RDW: 15.8 % — ABNORMAL HIGH (ref 11.5–15.5)
WBC: 10 10*3/uL (ref 4.0–10.5)
nRBC: 0 % (ref 0.0–0.2)

## 2020-05-03 LAB — TROPONIN I (HIGH SENSITIVITY)
Troponin I (High Sensitivity): 3 ng/L (ref <18)
Troponin I (High Sensitivity): 4 ng/L (ref <18)

## 2020-05-03 MED ORDER — KETOROLAC TROMETHAMINE 30 MG/ML IJ SOLN
30.0000 mg | Freq: Once | INTRAMUSCULAR | Status: AC
  Administered 2020-05-03: 30 mg via INTRAMUSCULAR
  Filled 2020-05-03: qty 1

## 2020-05-03 MED ORDER — ACETAMINOPHEN 500 MG PO TABS
1000.0000 mg | ORAL_TABLET | Freq: Once | ORAL | Status: AC
  Administered 2020-05-03: 1000 mg via ORAL
  Filled 2020-05-03: qty 2

## 2020-05-03 MED ORDER — GUAIFENESIN ER 600 MG PO TB12
1200.0000 mg | ORAL_TABLET | Freq: Once | ORAL | Status: AC
  Administered 2020-05-03: 1200 mg via ORAL
  Filled 2020-05-03: qty 2

## 2020-05-03 NOTE — ED Triage Notes (Signed)
Pt to ED via POV c/o shortness of breath since last night. Pt states that she has hx/o asthma and has been using her inhaler without relief. Pt states that she thinks that it is from her nose being so congested. Pt denies chest tightness or congestion. Pt is in NAD.

## 2020-05-03 NOTE — ED Notes (Signed)
See triage note- unable to breathe out of nose. Pt states she has felt congested since last night. Sinus pain upon palpation. Pt reports mom was positive for covid within the last two weeks. No chest pain.

## 2020-05-03 NOTE — Discharge Instructions (Addendum)
Please take Tylenol and ibuprofen/Advil for your pain.  It is safe to take them together, or to alternate them every few hours.  Take up to 1000mg  of Tylenol at a time, up to 4 times per day.  Do not take more than 4000 mg of Tylenol in 24 hours.  For ibuprofen, take 400-600 mg, 4-5 times per day.  I would also recommend buying over-the-counter extra strength Mucinex/guaifenesin.  I would avoid the combination medicines that include cough suppressants or Mucinex-D.  This will help thin the mucus in your sinuses he can clear it and breathe better.  Be sure to drink plenty of water with his medication.  Return to the ED with any fevers or worsening symptoms.  Continue your Symbicort as well.

## 2020-05-03 NOTE — ED Provider Notes (Signed)
[facility] Emergency Department Provider Note ____________________________________________   First MD Initiated Contact with Patient 05/03/20 1823     (approximate)  I have reviewed the triage vital signs and the nursing notes.  HISTORY  Chief Complaint Shortness of Breath   HPI Ashley Fry is a 39 y.o. femalewho presents to the ED for evaluation of upper respiratory congestion and shortness of breath.   Chart review indicates history of asthma on Symbicort, bipolar disorder.   Patient reports "couple days" of upper respiratory congestion, postnasal drip, sinus pressure and the inability to breathe out of her nose at night when she tries to sleep.  When asked about shortness of breath, she does report difficulty breathing through her nose at night when she rests, but she denies shortness of breath otherwise.  She reports associated sinus pressure to her bilateral maxillary and frontal sinuses.  She denies pain with eye movement, headache, syncope, fever, chest pain, productive cough, sore throat, abdominal pain, vomiting or diarrhea.  She reports tolerating p.o. intake and toileting at baseline.  Reports 5/10 aching pain to her bilateral maxillary sinuses that is constant and nonradiating.  Has not taken medications for this pain.  Past Medical History:  Diagnosis Date  . Alcohol abuse   . Asthma   . Bipolar 1 disorder ([place])   . Depression   . Seizures ([place])   . Substance abuse [facility])     Patient Active Problem List   Diagnosis Date Noted  . Cocaine abuse ([employer]) 02/14/2019  . Alcohol abuse 02/14/2019  . Substance induced mood disorder ([place]) 02/14/2019  . De Quervain's tenosynovitis, right 06/09/2017  . Multiple sclerosis ([place]) 05/20/2017  . Headache, chronic daily 05/19/2017  . Numbness and tingling 05/19/2017  . Chronic motor tic 11/21/2014  . Headache, unspecified headache type 11/21/2014  . Seizures ([place]) 11/21/2014  . Sleep disorder  11/21/2014  . Abortion 10/24/2013  . Contraception 10/24/2013    History reviewed. No pertinent surgical history.  Prior to Admission medications   Medication Sig Start Date End Date Taking? Authorizing Provider  albuterol (PROVENTIL HFA;VENTOLIN HFA) 108 (90 Base) MCG/ACT inhaler Inhale 2 puffs into the lungs every 6 (six) hours as needed for wheezing or shortness of breath. 09/13/18   Merlyn Lot, MD  budesonide-formoterol [facility]) 160-4.5 MCG/ACT inhaler Inhale 2 puffs into the lungs every 12 (twelve) hours. 11/21/18   Flora Lipps, MD  divalproex (DEPAKOTE) 250 MG DR tablet Take 1 tablet (250 mg total) by mouth 2 (two) times daily. 02/15/19   Clapacs, Madie Reno, MD  ondansetron (ZOFRAN ODT) 4 MG disintegrating tablet Take 1 tablet (4 mg total) by mouth every 8 (eight) hours as needed for nausea or vomiting. 07/13/19   Blake Divine, MD    Allergies Patient has no known allergies.  No family history on file.  Social History Social History   Tobacco Use  . Smoking status: Former Smoker    Packs/day: 0.50    Types: Cigarettes  . Smokeless tobacco: Never Used  Vaping Use  . Vaping Use: Never used  Substance Use Topics  . Alcohol use: Yes    Alcohol/week: 7.0 standard drinks    Types: 7 Cans of beer per week    Comment: daily  . Drug use: Yes    Types: Cocaine, Marijuana    Review of Systems  Constitutional: No fever/chills Eyes: No visual changes. ENT: No sore throat.  Positive for maxillary and frontal sinus pain.  Positive for upper respiratory congestion.  Cardiovascular: Denies chest pain. Respiratory: Positive for shortness of breath (inability to breathe through nose at night) Gastrointestinal: No abdominal pain.  No nausea, no vomiting.  No diarrhea.  No constipation. Genitourinary: Negative for dysuria. Musculoskeletal: Negative for back pain. Skin: Negative for rash. Neurological: Negative for headaches, focal weakness or  numbness.   ____________________________________________   PHYSICAL EXAM:  VITAL SIGNS: Vitals:   05/03/20 1445 05/03/20 1640  BP: 123/77 124/89  Pulse: 76 69  Resp: 16 17  Temp: 98.1 F (36.7 C) 98.9 F (37.2 C)  SpO2: 97% 99%      Constitutional: Alert and oriented. Well appearing and in no acute distress. Eyes: Conjunctivae are normal. PERRL. EOMI. Head: Atraumatic. Mild tenderness to palpation of bilateral maxillary and frontal sinuses. EOM intact without evidence of entrapment Nose: Upper respiratory congestion and clear rhinorrhea is present. Mouth/Throat: Mucous membranes are moist.  Oropharynx non-erythematous. Uvula is midline tonsils are 1+ bilaterally without exudate or swelling. Neck: No stridor. No cervical spine tenderness to palpation. Cardiovascular: Normal rate, regular rhythm. Grossly normal heart sounds.  Good peripheral circulation. Respiratory: Normal respiratory effort.  No retractions. Lungs CTAB. Gastrointestinal: Soft , nondistended, nontender to palpation. No abdominal bruits. No CVA tenderness. Musculoskeletal: No lower extremity tenderness nor edema.  No joint effusions. No signs of acute trauma. Neurologic:  Normal speech and language. No gross focal neurologic deficits are appreciated. No gait instability noted. Skin:  Skin is warm, dry and intact. No rash noted. Psychiatric: Mood and affect are normal. Speech and behavior are normal.  ____________________________________________   LABS (all labs ordered are listed, but only abnormal results are displayed)  Labs Reviewed  CBC WITH DIFFERENTIAL/PLATELET - Abnormal; Notable for the following components:      Result Value   Hemoglobin 11.6 (*)    HCT 34.6 (*)    MCV 75.1 (*)    MCH 25.2 (*)    RDW 15.8 (*)    All other components within normal limits  BASIC METABOLIC PANEL - Abnormal; Notable for the following components:   Potassium 3.4 (*)    Glucose, Bld 128 (*)    Creatinine, Ser  1.13 (*)    Calcium 8.8 (*)    All other components within normal limits  TROPONIN I (HIGH SENSITIVITY)  TROPONIN I (HIGH SENSITIVITY)   ____________________________________________  12 Lead EKG  Normal sinus rhythm, rate of 78 bpm, normal axis and intervals.  No evidence of acute ischemia. ____________________________________________  RADIOLOGY  ED MD interpretation: CXR reviewed and without evidence of acute cardiopulmonary pathology  Official radiology report(s): DG Chest 2 View  Result Date: 05/03/2020 CLINICAL DATA:  Short of breath and cough since yesterday, fever EXAM: CHEST - 2 VIEW COMPARISON:  09/13/2018 FINDINGS: The heart size and mediastinal contours are within normal limits. Both lungs are clear. The visualized skeletal structures are unremarkable. IMPRESSION: No active cardiopulmonary disease. Electronically Signed   By: Randa Ngo M.D.   On: 05/03/2020 15:22    ____________________________________________   PROCEDURES and INTERVENTIONS  Procedure(s) performed (including Critical Care):  Procedures  Medications  acetaminophen (TYLENOL) tablet 1,000 mg (1,000 mg Oral Given 05/03/20 1852)  guaiFENesin (MUCINEX) 12 hr tablet 1,200 mg (1,200 mg Oral Given 05/03/20 1852)  ketorolac (TORADOL) 30 MG/ML injection 30 mg (30 mg Intramuscular Given 05/03/20 1853)    ____________________________________________   MDM / ED COURSE  39 year old woman with history of asthma presents to the ED with complaints of shortness of breath, with evidence of viral URI, and amenable  to outpatient management.  Normal vital signs on room air.  Examination with upper respiratory congestion, clear rhinorrhea and maxillary tenderness is only signs of acute pathology.  She has no wheezing or reduced air movement to suggest an asthma exacerbation.  She has no uvular deviation or tonsillar swelling to suggest PTA.  She is well-appearing and has normal vital signs, making a bacterial  superinfection to require antibiotics less likely.  Nonischemic EKG.  CXR without evidence of pneumonia or other pathology.  Advised patient of outpatient management of her likely viral sinusitis with OTC medications.  We discussed outpatient management and return precautions for the ED.  Patient medically stable for discharge home.  ____________________________________________   FINAL CLINICAL IMPRESSION(S) / ED DIAGNOSES  Final diagnoses:  Viral URI  Congestion of upper respiratory tract     ED Discharge Orders    None       Yaiden Yang   Note:  This document was prepared using Dragon voice recognition software and may include unintentional dictation errors.   Vladimir Crofts, MD 05/03/20 2014

## 2020-05-04 ENCOUNTER — Emergency Department
Admission: EM | Admit: 2020-05-04 | Discharge: 2020-05-04 | Disposition: A | Discharge status: Home / Self Care | Payer: Medicaid Other | Attending: Emergency Medicine | Admitting: Emergency Medicine

## 2020-05-04 ENCOUNTER — Other Ambulatory Visit: Payer: Self-pay

## 2020-05-04 DIAGNOSIS — Z20822 Contact with and (suspected) exposure to covid-19: Secondary | ICD-10-CM | POA: Diagnosis not present

## 2020-05-04 DIAGNOSIS — R0602 Shortness of breath: Secondary | ICD-10-CM | POA: Diagnosis present

## 2020-05-04 DIAGNOSIS — J45909 Unspecified asthma, uncomplicated: Secondary | ICD-10-CM | POA: Diagnosis not present

## 2020-05-04 DIAGNOSIS — Z5321 Procedure and treatment not carried out due to patient leaving prior to being seen by health care provider: Secondary | ICD-10-CM | POA: Diagnosis not present

## 2020-05-04 LAB — RESPIRATORY PANEL BY RT PCR (FLU A&B, COVID)
Influenza A by PCR: NEGATIVE
Influenza B by PCR: NEGATIVE
SARS Coronavirus 2 by RT PCR: NEGATIVE

## 2020-05-04 NOTE — ED Triage Notes (Signed)
Pt states that she was seen yesterday for her asthma and states that she has been using her inhaler but isn't getting better yet and feels worse today, states sob is worse when she is up moving around

## 2020-05-13 ENCOUNTER — Ambulatory Visit: Payer: Medicaid Other | Attending: Internal Medicine

## 2020-05-13 DIAGNOSIS — Z23 Encounter for immunization: Secondary | ICD-10-CM

## 2020-05-13 NOTE — Progress Notes (Signed)
   Covid-19 Vaccination Clinic  Name:  Ashley Fry    MRN: [id number] DOB: [birth year]  05/13/2020  [name] was observed post Covid-19 immunization for 15 minutes without incident. She was provided with Vaccine Information Sheet and instruction to access the V-Safe system.   [name] was instructed to call 911 with any severe reactions post vaccine: Marland Kitchen Difficulty breathing  . Swelling of face and throat  . A fast heartbeat  . A bad rash all over body  . Dizziness and weakness   Immunizations Administered    Name Date Dose VIS Date Route   Pfizer COVID-19 Vaccine 05/13/2020  2:33 PM 0.3 mL 11/08/2018 Intramuscular   Manufacturer: [place]   Lot: [id number]   [place]: [id number]

## 2020-09-13 ENCOUNTER — Emergency Department
Admission: EM | Admit: 2020-09-13 | Discharge: 2020-09-13 | Disposition: A | Discharge status: Home / Self Care | Payer: Medicaid Other | Attending: Emergency Medicine | Admitting: Emergency Medicine

## 2020-09-13 ENCOUNTER — Other Ambulatory Visit: Payer: Self-pay

## 2020-09-13 DIAGNOSIS — Z7951 Long term (current) use of inhaled steroids: Secondary | ICD-10-CM | POA: Insufficient documentation

## 2020-09-13 DIAGNOSIS — Z87891 Personal history of nicotine dependence: Secondary | ICD-10-CM | POA: Insufficient documentation

## 2020-09-13 DIAGNOSIS — J45909 Unspecified asthma, uncomplicated: Secondary | ICD-10-CM | POA: Insufficient documentation

## 2020-09-13 DIAGNOSIS — Z79899 Other long term (current) drug therapy: Secondary | ICD-10-CM | POA: Insufficient documentation

## 2020-09-13 DIAGNOSIS — R0981 Nasal congestion: Secondary | ICD-10-CM | POA: Diagnosis present

## 2020-09-13 DIAGNOSIS — J0100 Acute maxillary sinusitis, unspecified: Secondary | ICD-10-CM | POA: Insufficient documentation

## 2020-09-13 MED ORDER — FEXOFENADINE-PSEUDOEPHED ER 60-120 MG PO TB12: 1.0000 | ORAL_TABLET | Freq: Two times a day (BID) | ORAL | 0 refills | Status: AC

## 2020-09-13 MED ORDER — BENZONATATE 100 MG PO CAPS: 200.0000 mg | ORAL_CAPSULE | Freq: Three times a day (TID) | ORAL | 0 refills | Status: DC | PRN

## 2020-09-13 MED ORDER — AMOXICILLIN 875 MG PO TABS: 875.0000 mg | ORAL_TABLET | Freq: Two times a day (BID) | ORAL | 0 refills | Status: DC

## 2020-09-13 NOTE — Discharge Instructions (Signed)
Follow discharge care instruction take medication as directed. °

## 2020-09-13 NOTE — ED Provider Notes (Signed)
[facility] Emergency Department Provider Note   ____________________________________________   Event Date/Time   First MD Initiated Contact with Patient 09/13/20 0945     (approximate)  I have reviewed the triage vital signs and the nursing notes.   HISTORY  Chief Complaint URI    HPI Ashley Fry is a 39 y.o. female patient complaint of being sick for 1 week.  Patient states nasal congestion and other flulike symptoms has increased in the past 2 days.  Patient has a negative Covid test yesterday.  Patient states increased nasal congestion, postnasal drainage, and cough..  Patient states chills but unsure of fever.  Patient denies recent travel or known contact with COVID-19.  Patient has taken COVID-19 vaccine and flu shot for this season.    Past Medical History:  Diagnosis Date  . Alcohol abuse   . Asthma   . Bipolar 1 disorder ([facility])   . Depression   . Seizures ([facility])   . Substance abuse [facility])     Patient Active Problem List   Diagnosis Date Noted  . Cocaine abuse ([facility]) 02/14/2019  . Alcohol abuse 02/14/2019  . Substance induced mood disorder ([facility]) 02/14/2019  . De Quervain's tenosynovitis, right 06/09/2017  . Multiple sclerosis ([facility]) 05/20/2017  . Headache, chronic daily 05/19/2017  . Numbness and tingling 05/19/2017  . Chronic motor tic 11/21/2014  . Headache, unspecified headache type 11/21/2014  . Seizures ([facility]) 11/21/2014  . Sleep disorder 11/21/2014  . Abortion 10/24/2013  . Contraception 10/24/2013    History reviewed. No pertinent surgical history.  Prior to Admission medications   Medication Sig Start Date End Date Taking? Authorizing Provider  amoxicillin (AMOXIL) 875 MG tablet Take 1 tablet (875 mg total) by mouth 2 (two) times daily. 09/13/20  Yes Joni Reining, PA-C  benzonatate (TESSALON PERLES) 100 MG capsule Take 2 capsules (200 mg total) by mouth 3 (three) times daily as needed. 09/13/20 09/13/21 Yes Joni Reining, PA-C  fexofenadine-pseudoephedrine (ALLEGRA-D) 60-120 MG 12 hr tablet Take 1 tablet by mouth 2 (two) times daily. 09/13/20  Yes Joni Reining, PA-C  albuterol (PROVENTIL HFA;VENTOLIN HFA) 108 (90 Base) MCG/ACT inhaler Inhale 2 puffs into the lungs every 6 (six) hours as needed for wheezing or shortness of breath. 09/13/18   Willy Eddy, MD  budesonide-formoterol [facility]) 160-4.5 MCG/ACT inhaler Inhale 2 puffs into the lungs every 12 (twelve) hours. 11/21/18   Erin Fulling, MD  divalproex (DEPAKOTE) 250 MG DR tablet Take 1 tablet (250 mg total) by mouth 2 (two) times daily. 02/15/19   Clapacs, Jackquline Denmark, MD  ondansetron (ZOFRAN ODT) 4 MG disintegrating tablet Take 1 tablet (4 mg total) by mouth every 8 (eight) hours as needed for nausea or vomiting. 07/13/19   Chesley Noon, MD    Allergies Patient has no known allergies.  History reviewed. No pertinent family history.  Social History Social History   Tobacco Use  . Smoking status: Former Smoker    Packs/day: 0.50    Types: Cigarettes  . Smokeless tobacco: Never Used  Vaping Use  . Vaping Use: Never used  Substance Use Topics  . Alcohol use: Yes    Alcohol/week: 7.0 standard drinks    Types: 7 Cans of beer per week    Comment: daily  . Drug use: Yes    Types: Cocaine, Marijuana   Review of Systems Constitutional: Chills. Eyes: No visual changes. ENT: No sore throat.  Nasal congestion. Cardiovascular: Denies chest pain. Respiratory: Denies  shortness of breath.  Nonproductive cough. Gastrointestinal: No abdominal pain.  No nausea, no vomiting.  No diarrhea.  No constipation. Genitourinary: Negative for dysuria. Musculoskeletal: Negative for back pain. Skin: Negative for rash. Neurological: Positive for frontal headache, but denies focal weakness or numbness. Psychiatric:  Alcohol abuse, bipolar, depression, and substance abuse. ____________________________________________   PHYSICAL EXAM:  VITAL SIGNS: ED  Triage Vitals  Enc Vitals Group     BP 09/13/20 0853 123/79     Pulse Rate 09/13/20 0853 78     Resp 09/13/20 0853 14     Temp 09/13/20 0853 98 F (36.7 C)     Temp Source 09/13/20 0853 Oral     SpO2 09/13/20 0853 99 %     Weight --      Height --      Head Circumference --      Peak Flow --      Pain Score 09/13/20 0854 0     Pain Loc --      Pain Edu? --      Excl. in [place]? --     Constitutional: Alert and oriented. Well appearing and in no acute distress. Eyes: Conjunctivae are normal. PERRL. EOMI. Head: Atraumatic. Nose: Bilateral maxillary guarding.  Edematous nasal turbinates.  Thick rhinorrhea. Mouth/Throat: Mucous membranes are moist.  Oropharynx non-erythematous.  Postnasal drainage. Neck: No stridor. Hematological/Lymphatic/Immunilogical: No cervical lymphadenopathy. Cardiovascular: Normal rate, regular rhythm. Grossly normal heart sounds.  Good peripheral circulation. Respiratory: Normal respiratory effort.  No retractions. Lungs CTAB. Genitourinary: Deferred Musculoskeletal: No lower extremity tenderness nor edema.  No joint effusions. Neurologic:  Normal speech and language. No gross focal neurologic deficits are appreciated. No gait instability. Skin:  Skin is warm, dry and intact. No rash noted. Psychiatric: Mood and affect are normal. Speech and behavior are normal.  ____________________________________________   LABS (all labs ordered are listed, but only abnormal results are displayed)  Labs Reviewed - No data to display ____________________________________________  EKG   ____________________________________________  RADIOLOGY I, Sable Feil, personally viewed and evaluated these images (plain radiographs) as part of my medical decision making, as well as reviewing the written report by the radiologist.  ED MD interpretation:    Official radiology report(s): No results  found.  ____________________________________________   PROCEDURES  Procedure(s) performed (including Critical Care):  Procedures   ____________________________________________   INITIAL IMPRESSION / ASSESSMENT AND PLAN / ED COURSE  As part of my medical decision making, I reviewed the following data within the [place]         Patient presents with 1 week of nasal congestion and nonproductive cough.  Patient complaint physical exam consistent with subacute maxillary sinusitis.  Patient given discharge care instruction work note.  Patient advised take medication as directed.  Patient advised follow-up PCP.      ____________________________________________   FINAL CLINICAL IMPRESSION(S) / ED DIAGNOSES  Final diagnoses:  Subacute maxillary sinusitis     ED Discharge Orders         Ordered    fexofenadine-pseudoephedrine (ALLEGRA-D) 60-120 MG 12 hr tablet  2 times daily        09/13/20 0958    amoxicillin (AMOXIL) 875 MG tablet  2 times daily        09/13/20 0958    benzonatate (TESSALON PERLES) 100 MG capsule  3 times daily PRN        09/13/20 0958          *Please note:  Ashley Fry was evaluated in Emergency Department on 09/13/2020 for the symptoms described in the history of present illness. She was evaluated in the context of the global COVID-19 pandemic, which necessitated consideration that the patient might be at risk for infection with the SARS-CoV-2 virus that causes COVID-19. Institutional protocols and algorithms that pertain to the evaluation of patients at risk for COVID-19 are in a state of rapid change based on information released by regulatory bodies including the CDC and federal and state organizations. These policies and algorithms were followed during the patient's care in the ED.  Some ED evaluations and interventions may be delayed as a result of limited staffing during and the pandemic.*   Note:  This document was  prepared using Dragon voice recognition software and may include unintentional dictation errors.    Sable Feil, PA-C 09/13/20 1008    Arta Silence, MD 09/13/20 1158

## 2020-09-13 NOTE — ED Triage Notes (Signed)
Pt presents via POV c/o flu like symptoms x2 days.

## 2020-12-05 ENCOUNTER — Emergency Department: Payer: Medicaid Other

## 2020-12-05 ENCOUNTER — Emergency Department
Admission: EM | Admit: 2020-12-05 | Discharge: 2020-12-05 | Disposition: A | Discharge status: Home / Self Care | Payer: Medicaid Other | Attending: Emergency Medicine | Admitting: Emergency Medicine

## 2020-12-05 ENCOUNTER — Other Ambulatory Visit: Payer: Self-pay

## 2020-12-05 DIAGNOSIS — J3489 Other specified disorders of nose and nasal sinuses: Secondary | ICD-10-CM | POA: Insufficient documentation

## 2020-12-05 DIAGNOSIS — J45909 Unspecified asthma, uncomplicated: Secondary | ICD-10-CM | POA: Insufficient documentation

## 2020-12-05 DIAGNOSIS — R519 Headache, unspecified: Secondary | ICD-10-CM | POA: Insufficient documentation

## 2020-12-05 DIAGNOSIS — Z79899 Other long term (current) drug therapy: Secondary | ICD-10-CM | POA: Insufficient documentation

## 2020-12-05 DIAGNOSIS — Z87891 Personal history of nicotine dependence: Secondary | ICD-10-CM | POA: Insufficient documentation

## 2020-12-05 DIAGNOSIS — Z20822 Contact with and (suspected) exposure to covid-19: Secondary | ICD-10-CM | POA: Insufficient documentation

## 2020-12-05 DIAGNOSIS — R42 Dizziness and giddiness: Secondary | ICD-10-CM | POA: Insufficient documentation

## 2020-12-05 DIAGNOSIS — R112 Nausea with vomiting, unspecified: Secondary | ICD-10-CM | POA: Insufficient documentation

## 2020-12-05 LAB — CBC
HCT: 35.8 % — ABNORMAL LOW (ref 36.0–46.0)
Hemoglobin: 11.6 g/dL — ABNORMAL LOW (ref 12.0–15.0)
MCH: 24.7 pg — ABNORMAL LOW (ref 26.0–34.0)
MCHC: 32.4 g/dL (ref 30.0–36.0)
MCV: 76.2 fL — ABNORMAL LOW (ref 80.0–100.0)
Platelets: 265 10*3/uL (ref 150–400)
RBC: 4.7 MIL/uL (ref 3.87–5.11)
RDW: 14.8 % (ref 11.5–15.5)
WBC: 8.7 10*3/uL (ref 4.0–10.5)
nRBC: 0 % (ref 0.0–0.2)

## 2020-12-05 LAB — BASIC METABOLIC PANEL
Anion gap: 8 (ref 5–15)
BUN: 13 mg/dL (ref 6–20)
CO2: 24 mmol/L (ref 22–32)
Calcium: 9.7 mg/dL (ref 8.9–10.3)
Chloride: 103 mmol/L (ref 98–111)
Creatinine, Ser: 1.05 mg/dL — ABNORMAL HIGH (ref 0.44–1.00)
GFR, Estimated: >60 mL/min (ref >60)
Glucose, Bld: 95 mg/dL (ref 70–99)
Potassium: 4.1 mmol/L (ref 3.5–5.1)
Sodium: 135 mmol/L (ref 135–145)

## 2020-12-05 LAB — URINALYSIS, COMPLETE (UACMP) WITH MICROSCOPIC
Bacteria, UA: NONE SEEN
Bilirubin Urine: NEGATIVE
Glucose, UA: NEGATIVE mg/dL
Hgb urine dipstick: NEGATIVE
Ketones, ur: NEGATIVE mg/dL
Nitrite: NEGATIVE
Protein, ur: NEGATIVE mg/dL
Specific Gravity, Urine: 1.003 — ABNORMAL LOW (ref 1.005–1.030)
pH: 7 (ref 5.0–8.0)

## 2020-12-05 LAB — PREGNANCY, URINE: Preg Test, Ur: NEGATIVE

## 2020-12-05 MED ORDER — LACTATED RINGERS IV BOLUS
1000.0000 mL | Freq: Once | INTRAVENOUS | Status: AC
  Administered 2020-12-05: 1000 mL via INTRAVENOUS

## 2020-12-05 MED ORDER — PROCHLORPERAZINE EDISYLATE 10 MG/2ML IJ SOLN
10.0000 mg | Freq: Once | INTRAMUSCULAR | Status: AC
  Administered 2020-12-05: 10 mg via INTRAVENOUS
  Filled 2020-12-05: qty 2

## 2020-12-05 MED ORDER — PROCHLORPERAZINE MALEATE 10 MG PO TABS: 10.0000 mg | ORAL_TABLET | Freq: Four times a day (QID) | ORAL | 0 refills | Status: AC | PRN

## 2020-12-05 MED ORDER — DIPHENHYDRAMINE HCL 50 MG/ML IJ SOLN
25.0000 mg | Freq: Once | INTRAMUSCULAR | Status: AC
  Administered 2020-12-05: 25 mg via INTRAVENOUS
  Filled 2020-12-05: qty 1

## 2020-12-05 NOTE — ED Notes (Signed)
Provided lemon mouth swabs for dry mouth. IV fluid still infusing.

## 2020-12-05 NOTE — ED Triage Notes (Signed)
Pt states that she felt dizzy and weak this morning; shortness of breath and a headache, pt took tylenol 3 to 4 hours ago, pt states has had migraines in the past;

## 2020-12-05 NOTE — ED Provider Notes (Signed)
[facility] Emergency Department Provider Note   ____________________________________________   Event Date/Time   First MD Initiated Contact with Patient 12/05/20 1456     (approximate)  I have reviewed the triage vital signs and the nursing notes.   HISTORY  Chief Complaint Dizziness    HPI Ashley Fry is a 40 y.o. female with past medical history of bipolar disorder, migraines, seizures, and polysubstance abuse who presents to the ED complaining of dizziness and headache.  Patient reports that overnight last night she started to develop diffuse throbbing headache that has gradually worsened since onset.  It is been associated with nausea and she had 1 episode of vomiting overnight, but she denies any abdominal pain or diarrhea.  She has had some drainage from her nose, also reports feeling very lightheaded and dizzy when she goes to stand up.  Along with the dizziness, she feels like she is having trouble catching her breath while she walks.  She denies any fevers, cough, chest pain.  Her LMP is unknown as she has irregular periods since having an IUD placed, but she denies any dysuria, hematuria, vaginal bleeding, or discharge.  She reports current headache is similar to prior migraines.        Past Medical History:  Diagnosis Date  . Alcohol abuse   . Asthma   . Bipolar 1 disorder ([place])   . Depression   . Seizures ([place])   . Substance abuse [facility])     Patient Active Problem List   Diagnosis Date Noted  . Cocaine abuse ([place]) 02/14/2019  . Alcohol abuse 02/14/2019  . Substance induced mood disorder ([place]) 02/14/2019  . De Quervain's tenosynovitis, right 06/09/2017  . Multiple sclerosis ([place]) 05/20/2017  . Headache, chronic daily 05/19/2017  . Numbness and tingling 05/19/2017  . Chronic motor tic 11/21/2014  . Headache, unspecified headache type 11/21/2014  . Seizures ([place]) 11/21/2014  . Sleep disorder 11/21/2014  . Abortion 10/24/2013  .  Contraception 10/24/2013    No past surgical history on file.  Prior to Admission medications   Medication Sig Start Date End Date Taking? Authorizing Provider  prochlorperazine (COMPAZINE) 10 MG tablet Take 1 tablet (10 mg total) by mouth every 6 (six) hours as needed for nausea or vomiting. 12/05/20  Yes Blake Divine, MD  albuterol (PROVENTIL HFA;VENTOLIN HFA) 108 (90 Base) MCG/ACT inhaler Inhale 2 puffs into the lungs every 6 (six) hours as needed for wheezing or shortness of breath. 09/13/18   Merlyn Lot, MD  amoxicillin (AMOXIL) 875 MG tablet Take 1 tablet (875 mg total) by mouth 2 (two) times daily. 09/13/20   Sable Feil, PA-C  benzonatate (TESSALON PERLES) 100 MG capsule Take 2 capsules (200 mg total) by mouth 3 (three) times daily as needed. 09/13/20 09/13/21  Sable Feil, PA-C  budesonide-formoterol [facility]) 160-4.5 MCG/ACT inhaler Inhale 2 puffs into the lungs every 12 (twelve) hours. 11/21/18   Flora Lipps, MD  divalproex (DEPAKOTE) 250 MG DR tablet Take 1 tablet (250 mg total) by mouth 2 (two) times daily. 02/15/19   Clapacs, Madie Reno, MD  fexofenadine-pseudoephedrine (ALLEGRA-D) 60-120 MG 12 hr tablet Take 1 tablet by mouth 2 (two) times daily. 09/13/20   Sable Feil, PA-C  ondansetron (ZOFRAN ODT) 4 MG disintegrating tablet Take 1 tablet (4 mg total) by mouth every 8 (eight) hours as needed for nausea or vomiting. 07/13/19   Blake Divine, MD    Allergies Patient has no known allergies.  No family  history on file.  Social History Social History   Tobacco Use  . Smoking status: Former Smoker    Packs/day: 0.50    Types: Cigarettes  . Smokeless tobacco: Never Used  Vaping Use  . Vaping Use: Never used  Substance Use Topics  . Alcohol use: Yes    Alcohol/week: 7.0 standard drinks    Types: 7 Cans of beer per week    Comment: daily  . Drug use: Yes    Types: Cocaine, Marijuana    Review of Systems  Constitutional: No fever/chills.  Positive  for dizziness and lightheadedness. Eyes: No visual changes. ENT: No sore throat. Cardiovascular: Denies chest pain. Respiratory: Positive for shortness of breath. Gastrointestinal: No abdominal pain.  Positive for nausea and vomiting.  No diarrhea.  No constipation. Genitourinary: Negative for dysuria. Musculoskeletal: Negative for back pain. Skin: Negative for rash. Neurological: Positive for headaches, negative for focal weakness or numbness.  ____________________________________________   PHYSICAL EXAM:  VITAL SIGNS: ED Triage Vitals  Enc Vitals Group     BP 12/05/20 1356 (!) 136/93     Pulse Rate 12/05/20 1356 74     Resp --      Temp 12/05/20 1356 98.1 F (36.7 C)     Temp Source 12/05/20 1356 Oral     SpO2 12/05/20 1356 100 %     Weight 12/05/20 1357 138 lb (62.6 kg)     Height 12/05/20 1357 5' (1.524 m)     Head Circumference --      Peak Flow --      Pain Score 12/05/20 1357 0     Pain Loc --      Pain Edu? --      Excl. in [place]? --     Constitutional: Alert and oriented. Eyes: Conjunctivae are normal.  Pupils equal round reactive to light bilaterally. Head: Atraumatic. Nose: No congestion/rhinnorhea. Mouth/Throat: Mucous membranes are moist. Neck: Normal ROM Cardiovascular: Normal rate, regular rhythm. Grossly normal heart sounds. Respiratory: Normal respiratory effort.  No retractions. Lungs CTAB. Gastrointestinal: Soft and nontender. No distention. Genitourinary: deferred Musculoskeletal: No lower extremity tenderness nor edema. Neurologic:  Normal speech and language. No gross focal neurologic deficits are appreciated. Skin:  Skin is warm, dry and intact. No rash noted. Psychiatric: Mood and affect are normal. Speech and behavior are normal.  ____________________________________________   LABS (all labs ordered are listed, but only abnormal results are displayed)  Labs Reviewed  BASIC METABOLIC PANEL - Abnormal; Notable for the following components:       Result Value   Creatinine, Ser 1.05 (*)    All other components within normal limits  CBC - Abnormal; Notable for the following components:   Hemoglobin 11.6 (*)    HCT 35.8 (*)    MCV 76.2 (*)    MCH 24.7 (*)    All other components within normal limits  URINALYSIS, COMPLETE (UACMP) WITH MICROSCOPIC - Abnormal; Notable for the following components:   Color, Urine COLORLESS (*)    APPearance CLEAR (*)    Specific Gravity, Urine 1.003 (*)    Leukocytes,Ua TRACE (*)    All other components within normal limits  SARS CORONAVIRUS 2 (TAT 6-24 HRS)  PREGNANCY, URINE   ____________________________________________  EKG  ED ECG REPORT I, Blake Divine, the attending physician, personally viewed and interpreted this ECG.   Date: 12/05/2020  EKG Time: 14:14  Rate: 74  Rhythm: normal sinus rhythm  Axis: Normal  Intervals:none  ST&T Change: None  PROCEDURES  Procedure(s) performed (including Critical Care):  Procedures   ____________________________________________   INITIAL IMPRESSION / ASSESSMENT AND PLAN / ED COURSE       40 year old female with past medical history of migraines, seizures, polysubstance abuse, and bipolar disorder who presents to the ED for about 12 hours of worsening headache with nausea, vomiting, lightheadedness, and dyspnea on exertion.  Patient is not in any respiratory distress and is maintaining O2 sats on room air.  EKG shows no evidence of arrhythmia or ischemia and I have low suspicion for cardiac process.  I doubt PE as she is PERC negative.  Symptoms sound most consistent with migraine, versus viral illness, dehydration, electrolyte abnormality.  Doubt stroke as she has no focal neurologic deficits and dizziness is described as a lightheadedness rather than vertigo.  She is ambulatory without difficulty here in the ED.  Lab work is also reassuring, UA shows no evidence of infection.  We will add on pregnancy test, hydrate with IV fluids and  treat symptomatically with Compazine and Benadryl.  Patient reports feeling better following dose of Compazine and Benadryl.  Pregnancy testing is negative and patient is requesting to be discharged home.  She is appropriate for PCP follow-up and will be prescribed short course of Compazine, was counseled to return to the ED for new worsening symptoms.  Patient agrees with plan.      ____________________________________________   FINAL CLINICAL IMPRESSION(S) / ED DIAGNOSES  Final diagnoses:  Lightheadedness  Acute nonintractable headache, unspecified headache type     ED Discharge Orders         Ordered    prochlorperazine (COMPAZINE) 10 MG tablet  Every 6 hours PRN        12/05/20 1708           Note:  This document was prepared using Dragon voice recognition software and may include unintentional dictation errors.   Blake Divine, MD 12/05/20 [phone]

## 2020-12-05 NOTE — ED Notes (Signed)
No covid swabs. Called lab to send more.

## 2020-12-05 NOTE — ED Notes (Signed)
E signature pad not working. Pt signed paper consent to discharge.

## 2020-12-05 NOTE — ED Notes (Addendum)
Pt states has felt weak since yesterday. Feels dizzy and "shaky" when stands up. Legs feel weak and shaky. Had N/V 2 nights ago but not since then. No diarrhea.  She believes she is dehydrated. Hardly any appetite since 2d. Has had HA since yesterday. Says feels dyspneic with exertion since yesterday. Also has nasal congestion. Has IUD and does not get periods.

## 2020-12-06 LAB — SARS CORONAVIRUS 2 (TAT 6-24 HRS): SARS Coronavirus 2: NEGATIVE

## 2021-04-28 IMAGING — CR DG CHEST 2V
1 series · 2 of 2 positions shown · non-contrast
Comparison: May 03, 2020

CLINICAL DATA: Shortness of breath

EXAM:
CHEST - 2 VIEW

[Series 1: dg chest 2 view · 0.14mm/px · 2 of 2 slices shown]
[im 1/2]
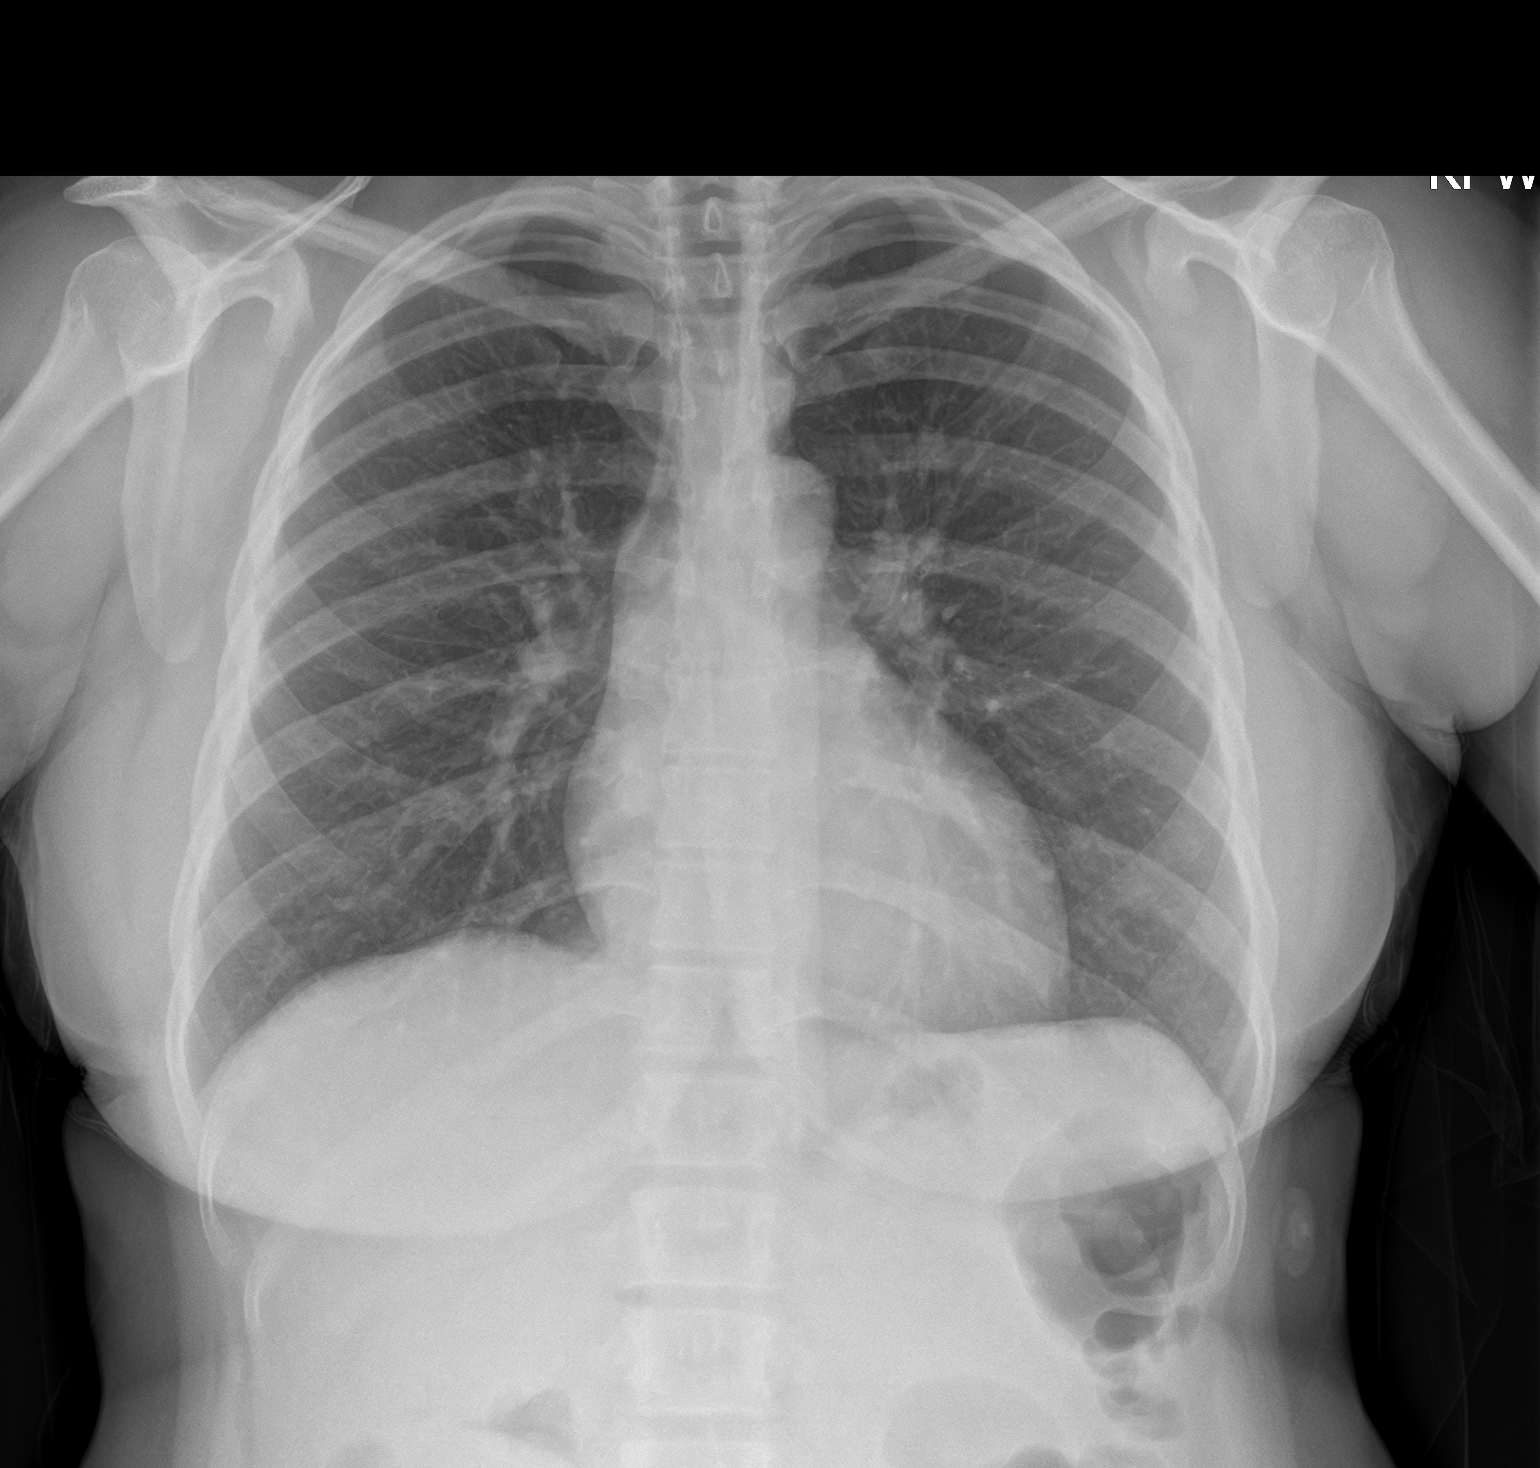
[im 2/2]
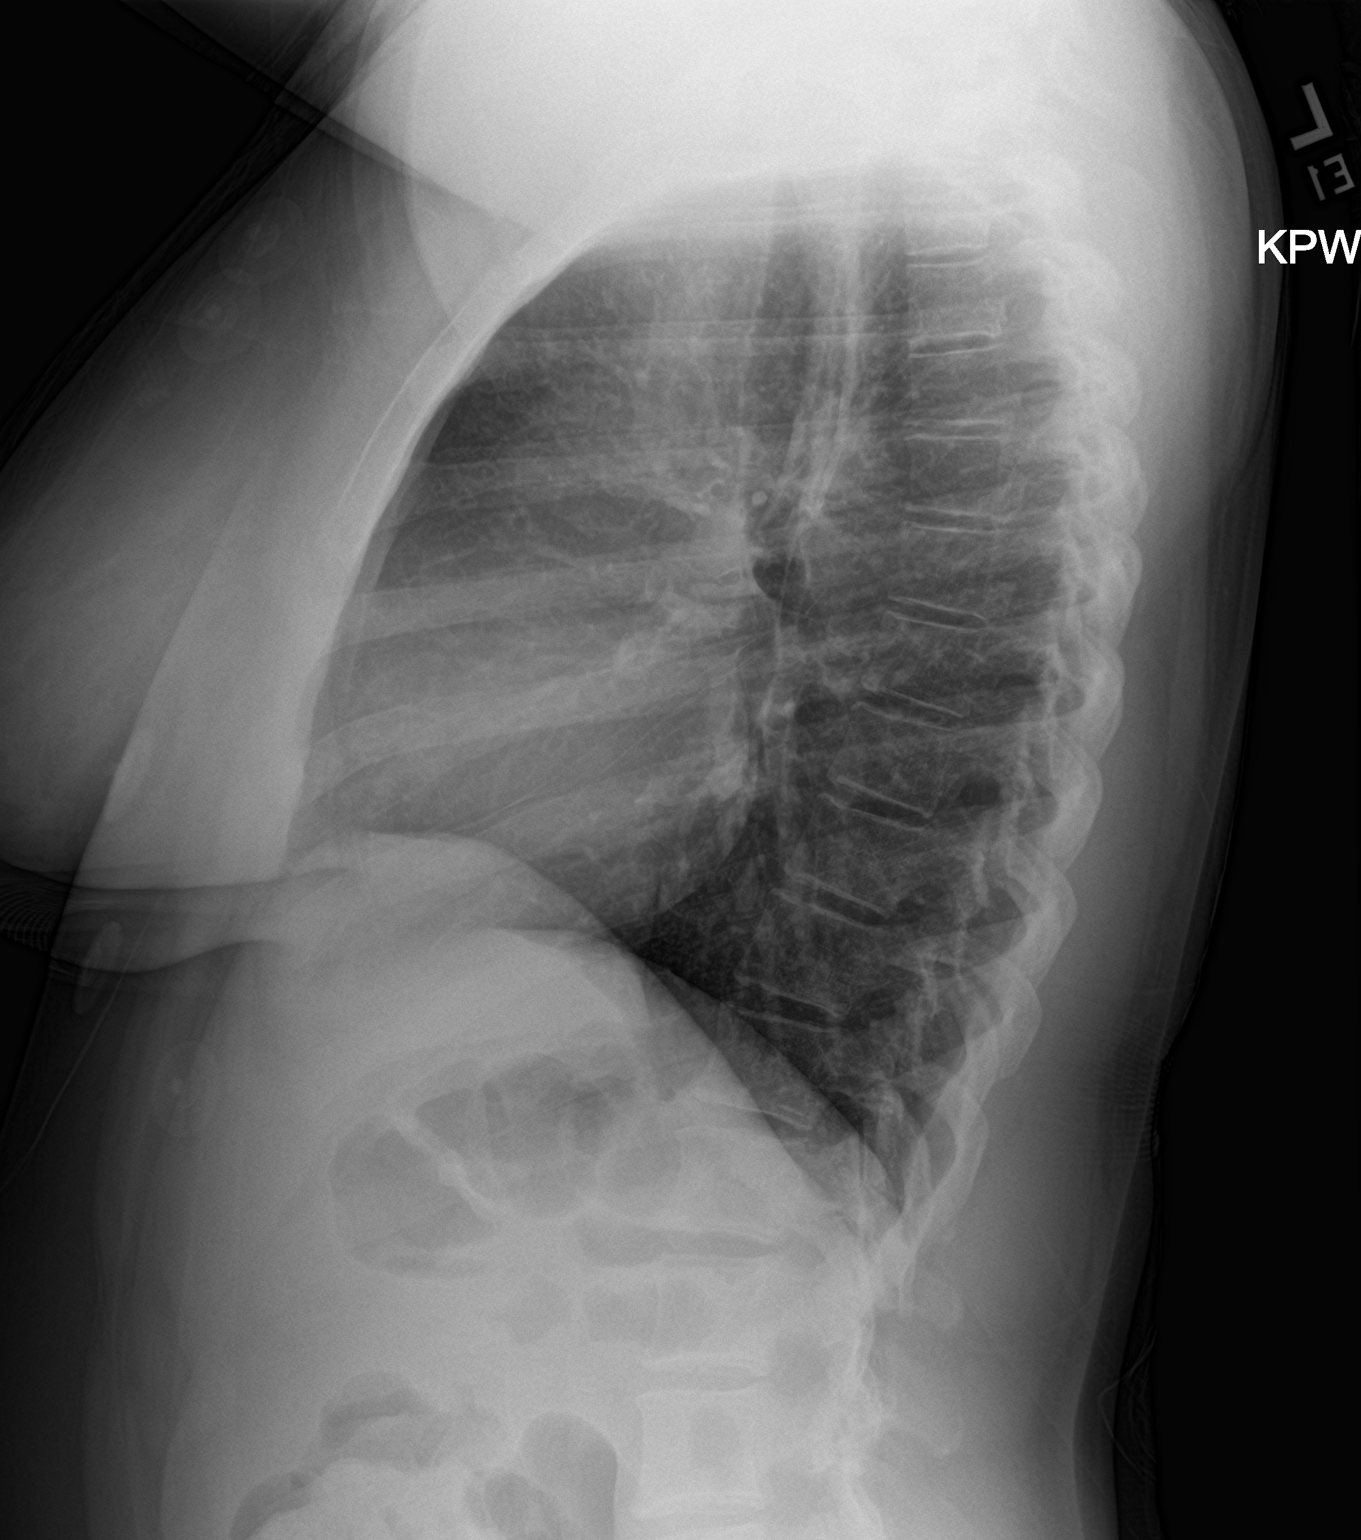

[2 of 2 positions shown; findings below may reference images not displayed]

FINDINGS: Lungs are clear. Heart size and pulmonary vascularity are normal. No
adenopathy. No pneumothorax. No bone lesions.
IMPRESSION: Lungs clear.  Cardiac silhouette normal.

## 2021-05-04 ENCOUNTER — Other Ambulatory Visit: Payer: Self-pay

## 2021-05-04 ENCOUNTER — Emergency Department: Payer: Medicaid Other

## 2021-05-04 ENCOUNTER — Emergency Department
Admission: EM | Admit: 2021-05-04 | Discharge: 2021-05-04 | Disposition: A | Discharge status: Home / Self Care | Payer: Medicaid Other | Attending: Emergency Medicine | Admitting: Emergency Medicine

## 2021-05-04 DIAGNOSIS — Z7951 Long term (current) use of inhaled steroids: Secondary | ICD-10-CM | POA: Insufficient documentation

## 2021-05-04 DIAGNOSIS — R0602 Shortness of breath: Secondary | ICD-10-CM | POA: Diagnosis present

## 2021-05-04 DIAGNOSIS — Z20822 Contact with and (suspected) exposure to covid-19: Secondary | ICD-10-CM

## 2021-05-04 DIAGNOSIS — J069 Acute upper respiratory infection, unspecified: Secondary | ICD-10-CM

## 2021-05-04 DIAGNOSIS — Z87891 Personal history of nicotine dependence: Secondary | ICD-10-CM | POA: Insufficient documentation

## 2021-05-04 DIAGNOSIS — J45909 Unspecified asthma, uncomplicated: Secondary | ICD-10-CM | POA: Insufficient documentation

## 2021-05-04 LAB — RESP PANEL BY RT-PCR (FLU A&B, COVID) ARPGX2
Influenza A by PCR: NEGATIVE
Influenza B by PCR: NEGATIVE
SARS Coronavirus 2 by RT PCR: NEGATIVE

## 2021-05-04 MED ORDER — BENZONATATE 100 MG PO CAPS: ORAL_CAPSULE | ORAL | 0 refills | Status: AC

## 2021-05-04 MED ORDER — ALBUTEROL SULFATE HFA 108 (90 BASE) MCG/ACT IN AERS: 2.0000 | INHALATION_SPRAY | Freq: Four times a day (QID) | RESPIRATORY_TRACT | 0 refills | Status: AC | PRN

## 2021-05-04 MED ORDER — ALBUTEROL SULFATE (2.5 MG/3ML) 0.083% IN NEBU: 2.5000 mg | INHALATION_SOLUTION | Freq: Four times a day (QID) | RESPIRATORY_TRACT | 0 refills | Status: AC | PRN

## 2021-05-04 MED ORDER — NIRMATRELVIR/RITONAVIR (PAXLOVID)TABLET: 3.0000 | ORAL_TABLET | Freq: Two times a day (BID) | ORAL | 0 refills | Status: AC

## 2021-05-04 NOTE — ED Triage Notes (Signed)
Pt states she has had a hard time catching her breath the past 4-5 days- pt has a hx of asthma- pt states she has also had a cough and congestion- pt denies fevers- pt's fiance had covid 2 weeks ago- pt took 2 rapid tests that were neg but took a pcr that was pos for covid

## 2021-05-04 NOTE — ED Provider Notes (Signed)
[facility] Emergency Department Provider Note ____________________________________________  Time seen: 1806  I have reviewed the triage vital signs and the nursing notes.  HISTORY  Chief Complaint  Shortness of Breath and Nasal Congestion   HPI Ashley Fry is a 40 y.o. female with below medical history, presents her self to the ED for evaluation of symptoms including shortness of breath cough and congestion.  Patient with a history of asthma reports symptoms for the last 3 to 5 days.  She denies any interim fevers, chills, sweats, or chest pain.  She reports her fianc had COVID 2 weeks ago, and the patient is requesting a COVID PCR test. She has not had a previous positive covid PCR.  Past Medical History:  Diagnosis Date   Alcohol abuse    Asthma    Bipolar 1 disorder ([place])    Depression    Seizures ([place])    Substance abuse ([place])     Patient Active Problem List   Diagnosis Date Noted   Cocaine abuse ([place]) 02/14/2019   Alcohol abuse 02/14/2019   Substance induced mood disorder ([place]) 02/14/2019   De Quervain's tenosynovitis, right 06/09/2017   Multiple sclerosis ([place]) 05/20/2017   Headache, chronic daily 05/19/2017   Numbness and tingling 05/19/2017   Chronic motor tic 11/21/2014   Headache, unspecified headache type 11/21/2014   Seizures ([place]) 11/21/2014   Sleep disorder 11/21/2014   Abortion 10/24/2013   Contraception 10/24/2013    History reviewed. No pertinent surgical history.  Prior to Admission medications   Medication Sig Start Date End Date Taking? Authorizing Provider  albuterol (PROVENTIL) (2.5 MG/3ML) 0.083% nebulizer solution Take 3 mLs (2.5 mg total) by nebulization every 6 (six) hours as needed for up to 10 days for wheezing or shortness of breath. 05/04/21 05/14/21 Yes Jakye Mullens, Dannielle Karvonen, PA-C  albuterol (VENTOLIN HFA) 108 (90 Base) MCG/ACT inhaler Inhale 2 puffs into the lungs every 6 (six) hours as needed for shortness of  breath. 05/04/21  Yes Abass Misener, Dannielle Karvonen, PA-C  benzonatate (TESSALON PERLES) 100 MG capsule Take 1-2 tabs TID prn cough 05/04/21  Yes Charline Hoskinson, Dannielle Karvonen, PA-C  nirmatrelvir/ritonavir EUA (PAXLOVID) 20 x 150 MG & 10 x '100MG'$  TABS Take 3 tablets by mouth 2 (two) times daily for 5 days. Patient GFR is >60. Take nirmatrelvir (150 mg) two tablets twice daily for 5 days and ritonavir (100 mg) one tablet twice daily for 5 days. 05/04/21 05/09/21 Yes Roselene Gray, Dannielle Karvonen, PA-C  budesonide-formoterol (SYMBICORT) 160-4.5 MCG/ACT inhaler Inhale 2 puffs into the lungs every 12 (twelve) hours. 11/21/18   Flora Lipps, MD  fexofenadine-pseudoephedrine (ALLEGRA-D) 60-120 MG 12 hr tablet Take 1 tablet by mouth 2 (two) times daily. 09/13/20   Sable Feil, PA-C  prochlorperazine (COMPAZINE) 10 MG tablet Take 1 tablet (10 mg total) by mouth every 6 (six) hours as needed for nausea or vomiting. 12/05/20   Blake Divine, MD    Allergies Patient has no known allergies.  History reviewed. No pertinent family history.  Social History Social History   Tobacco Use   Smoking status: Former    Packs/day: 0.50    Types: Cigarettes   Smokeless tobacco: Never  Vaping Use   Vaping Use: Never used  Substance Use Topics   Alcohol use: Yes    Alcohol/week: 7.0 standard drinks    Types: 7 Cans of beer per week    Comment: daily   Drug use: Yes    Types: Cocaine,  Marijuana    Review of Systems  Constitutional: Negative for fever. Eyes: Negative for visual changes. ENT: Negative for sore throat. Cardiovascular: Negative for chest pain. Respiratory: Positive for shortness of breath. Gastrointestinal: Negative for abdominal pain, vomiting and diarrhea. Genitourinary: Negative for dysuria. Musculoskeletal: Negative for back pain. Skin: Negative for rash. Neurological: Negative for headaches, focal weakness or numbness. ____________________________________________  PHYSICAL EXAM:  VITAL SIGNS: ED  Triage Vitals  Enc Vitals Group     BP 05/04/21 1533 140/89     Pulse Rate 05/04/21 1533 74     Resp 05/04/21 1533 20     Temp 05/04/21 1533 98 F (36.7 C)     Temp Source 05/04/21 1533 Oral     SpO2 05/04/21 1533 100 %     Weight 05/04/21 1534 140 lb (63.5 kg)     Height 05/04/21 1534 5' (1.524 m)     Head Circumference --      Peak Flow --      Pain Score 05/04/21 1534 0     Pain Loc --      Pain Edu? --      Excl. in [place]? --     Constitutional: Alert and oriented. Well appearing and in no distress. Head: Normocephalic and atraumatic. Eyes: Conjunctivae are normal. Normal extraocular movements Cardiovascular: Normal rate, regular rhythm. Normal distal pulses. Respiratory: Normal respiratory effort. No wheezes/rales/rhonchi. Gastrointestinal: Soft and nontender. No distention. Musculoskeletal: Nontender with normal range of motion in all extremities.  Neurologic:  Normal gait without ataxia. Normal speech and language. No gross focal neurologic deficits are appreciated. Skin:  Skin is warm, dry and intact. No rash noted. Psychiatric: Mood and affect are normal. Patient exhibits appropriate insight and judgment. ____________________________________________    {LABS (pertinent positives/negatives)  Labs Reviewed  RESP PANEL BY RT-PCR (FLU A&B, COVID) [id number]  ____________________________________________  {EKG  ____________________________________________   RADIOLOGY Official radiology report(s): DG Chest 2 View  Result Date: 05/04/2021 CLINICAL DATA:  COVID/short of breath. Shortness of breath. Productive cough. Lightheaded and dizzy for 4 days. Current smoker. EXAM: CHEST - 2 VIEW COMPARISON:  12/05/2020 FINDINGS: The heart size and mediastinal contours are within normal limits. Both lungs are clear. The visualized skeletal structures are unremarkable. IMPRESSION: No active cardiopulmonary disease. Electronically Signed   By: Lucienne Capers M.D.   On: 05/04/2021 17:51    ____________________________________________  PROCEDURES   Procedures ____________________________________________   INITIAL IMPRESSION / ASSESSMENT AND PLAN / ED COURSE  As part of my medical decision making, I reviewed the following data within the [place] reviewed pending, Radiograph reviewed NAD, and Notes from prior ED visits    DDX: Covid, influenza, Viral URI, asthma excerbation    Patient ED evaluation and request for COVID PCR test, following recent increase in shortness of breath, and on close contact with positive COVID.  Patient has had 2 subsequent negative home test.  She is evaluated in the ED and found to have a normal and reassuring exam, negative chest x-ray.  No signs of acute respiratory distress or dehydration.  Patient will await COVID test results at home.  She will continue to monitor and treat any symptoms and necessary.  A work note is provided for her benefit.  LIV FOOS was evaluated in Emergency Department on 05/04/2021 for the symptoms described in the history of present illness. She was evaluated in the context of the global COVID-19 pandemic, which necessitated consideration that the patient might be at risk  for infection with the SARS-CoV-2 virus that causes COVID-19. Institutional protocols and algorithms that pertain to the evaluation of patients at risk for COVID-19 are in a state of rapid change based on information released by regulatory bodies including the CDC and federal and state organizations. These policies and algorithms were followed during the patient's care in the ED. ____________________________________________  FINAL CLINICAL IMPRESSION(S) / ED DIAGNOSES  Final diagnoses:  Viral upper respiratory tract infection  Close exposure to COVID-19 virus      Carmie End, Dannielle Karvonen, PA-C 05/04/21 2014    Naaman Plummer, MD 05/04/21 2257

## 2021-05-04 NOTE — Discharge Instructions (Addendum)
Take the prescription meds as directed. Follow-up with your provider for continued symptoms. Return to the ED as needed.

## 2021-07-02 ENCOUNTER — Emergency Department
Admission: EM | Admit: 2021-07-02 | Discharge: 2021-07-02 | Disposition: A | Discharge status: Home / Self Care | Payer: Medicaid Other | Attending: Emergency Medicine | Admitting: Emergency Medicine

## 2021-07-02 ENCOUNTER — Other Ambulatory Visit: Payer: Self-pay

## 2021-07-02 DIAGNOSIS — J029 Acute pharyngitis, unspecified: Secondary | ICD-10-CM | POA: Diagnosis present

## 2021-07-02 DIAGNOSIS — Z7951 Long term (current) use of inhaled steroids: Secondary | ICD-10-CM | POA: Diagnosis not present

## 2021-07-02 DIAGNOSIS — J069 Acute upper respiratory infection, unspecified: Secondary | ICD-10-CM | POA: Diagnosis not present

## 2021-07-02 DIAGNOSIS — J45909 Unspecified asthma, uncomplicated: Secondary | ICD-10-CM | POA: Insufficient documentation

## 2021-07-02 DIAGNOSIS — Z20822 Contact with and (suspected) exposure to covid-19: Secondary | ICD-10-CM | POA: Diagnosis not present

## 2021-07-02 DIAGNOSIS — J3489 Other specified disorders of nose and nasal sinuses: Secondary | ICD-10-CM | POA: Diagnosis not present

## 2021-07-02 DIAGNOSIS — Z87891 Personal history of nicotine dependence: Secondary | ICD-10-CM | POA: Insufficient documentation

## 2021-07-02 LAB — RESP PANEL BY RT-PCR (FLU A&B, COVID) ARPGX2
Influenza A by PCR: NEGATIVE
Influenza B by PCR: NEGATIVE
SARS Coronavirus 2 by RT PCR: NEGATIVE

## 2021-07-02 NOTE — ED Triage Notes (Signed)
Pt comes pov with ear pain, sore throat, congestion since Sunday. No fevers.

## 2021-07-02 NOTE — Discharge Instructions (Addendum)
Take 10 mg of Zyrtec before bed. Take one spray of Flonase each side.

## 2021-07-02 NOTE — ED Notes (Signed)
LOW UP PCP INFO PROVIDED ALL QUESTIONS ANSWERED

## 2021-07-02 NOTE — ED Provider Notes (Signed)
[facility]-EMERGENCY DEPARTMENT  ____________________________________________  Time seen: Approximately 5:52 PM  I have reviewed the triage vital signs and the nursing notes.   HISTORY  Chief Complaint Sore Throat   Historian Patient     HPI Ashley Fry is a 40 y.o. female presents to the emergency department with bilateral ear pain, pharyngitis, nasal congestion, rhinorrhea and nonproductive cough for the past 2 to 3 days.  Patient states that "I know I have a upper respiratory tract infection."  She denies chest pain, chest tightness or shortness of breath.   Past Medical History:  Diagnosis Date   Alcohol abuse    Asthma    Bipolar 1 disorder ([place])    Depression    Seizures ([place])    Substance abuse ([place])      Immunizations up to date:  Yes.     Past Medical History:  Diagnosis Date   Alcohol abuse    Asthma    Bipolar 1 disorder ([place])    Depression    Seizures ([place])    Substance abuse ([place])     Patient Active Problem List   Diagnosis Date Noted   Cocaine abuse ([place]) 02/14/2019   Alcohol abuse 02/14/2019   Substance induced mood disorder ([place]) 02/14/2019   De Quervain's tenosynovitis, right 06/09/2017   Multiple sclerosis ([place]) 05/20/2017   Headache, chronic daily 05/19/2017   Numbness and tingling 05/19/2017   Chronic motor tic 11/21/2014   Headache, unspecified headache type 11/21/2014   Seizures ([place]) 11/21/2014   Sleep disorder 11/21/2014   Abortion 10/24/2013   Contraception 10/24/2013    History reviewed. No pertinent surgical history.  Prior to Admission medications   Medication Sig Start Date End Date Taking? Authorizing Provider  albuterol (PROVENTIL) (2.5 MG/3ML) 0.083% nebulizer solution Take 3 mLs (2.5 mg total) by nebulization every 6 (six) hours as needed for up to 10 days for wheezing or shortness of breath. 05/04/21 05/14/21  Menshew, Dannielle Karvonen, PA-C  albuterol (VENTOLIN HFA) 108 (90 Base) MCG/ACT inhaler Inhale 2 puffs into  the lungs every 6 (six) hours as needed for shortness of breath. 05/04/21   Menshew, Dannielle Karvonen, PA-C  benzonatate (TESSALON PERLES) 100 MG capsule Take 1-2 tabs TID prn cough 05/04/21   Menshew, Dannielle Karvonen, PA-C  budesonide-formoterol (SYMBICORT) 160-4.5 MCG/ACT inhaler Inhale 2 puffs into the lungs every 12 (twelve) hours. 11/21/18   Flora Lipps, MD  fexofenadine-pseudoephedrine (ALLEGRA-D) 60-120 MG 12 hr tablet Take 1 tablet by mouth 2 (two) times daily. 09/13/20   Sable Feil, PA-C  prochlorperazine (COMPAZINE) 10 MG tablet Take 1 tablet (10 mg total) by mouth every 6 (six) hours as needed for nausea or vomiting. 12/05/20   Blake Divine, MD    Allergies Patient has no known allergies.  History reviewed. No pertinent family history.  Social History Social History   Tobacco Use   Smoking status: Former    Packs/day: 0.50    Types: Cigarettes   Smokeless tobacco: Never  Vaping Use   Vaping Use: Never used  Substance Use Topics   Alcohol use: Yes    Alcohol/week: 7.0 standard drinks    Types: 7 Cans of beer per week    Comment: daily   Drug use: Yes    Types: Cocaine, Marijuana      Review of Systems  Constitutional: Patient has fever.  Eyes: No visual changes. No discharge ENT: Patient has congestion.  Cardiovascular: no chest pain. Respiratory: Patient has cough.  Gastrointestinal: No  abdominal pain.  No nausea, no vomiting. Patient had diarrhea.  Genitourinary: Negative for dysuria. No hematuria Musculoskeletal: Patient has myalgias.  Skin: Negative for rash, abrasions, lacerations, ecchymosis. Neurological: Patient has headache, no focal weakness or numbness.     ____________________________________________   PHYSICAL EXAM:  VITAL SIGNS: ED Triage Vitals  Enc Vitals Group     BP 07/02/21 1503 (!) 125/91     Pulse Rate 07/02/21 1503 72     Resp 07/02/21 1503 18     Temp 07/02/21 1503 98.2 F (36.8 C)     Temp Source 07/02/21 1503 Oral      SpO2 07/02/21 1503 100 %     Weight 07/02/21 1502 138 lb (62.6 kg)     Height 07/02/21 1502 5' (1.524 m)     Head Circumference --      Peak Flow --      Pain Score 07/02/21 1502 0     Pain Loc --      Pain Edu? --      Excl. in [place]? --      Constitutional: Alert and oriented. Patient is lying supine. Eyes: Conjunctivae are normal. PERRL. EOMI. Head: Atraumatic. ENT:      Ears: Tympanic membranes are mildly injected with mild effusion bilaterally.       Nose: No congestion/rhinnorhea.      Mouth/Throat: Mucous membranes are moist. Posterior pharynx is mildly erythematous.  Hematological/Lymphatic/Immunilogical: No cervical lymphadenopathy.  Cardiovascular: Normal rate, regular rhythm. Normal S1 and S2.  Good peripheral circulation. Respiratory: Normal respiratory effort without tachypnea or retractions. Lungs CTAB. Good air entry to the bases with no decreased or absent breath sounds. Gastrointestinal: Bowel sounds 4 quadrants. Soft and nontender to palpation. No guarding or rigidity. No palpable masses. No distention. No CVA tenderness. Musculoskeletal: Full range of motion to all extremities. No gross deformities appreciated. Neurologic:  Normal speech and language. No gross focal neurologic deficits are appreciated.  Skin:  Skin is warm, dry and intact. No rash noted. Psychiatric: Mood and affect are normal. Speech and behavior are normal. Patient exhibits appropriate insight and judgement.  ____________________________________________   LABS (all labs ordered are listed, but only abnormal results are displayed)  Labs Reviewed  RESP PANEL BY RT-PCR (FLU A&B, COVID) [id number]   ____________________________________________  EKG   ____________________________________________  RADIOLOGY  No results found.  ____________________________________________    PROCEDURES  Procedure(s) performed:     Procedures     Medications - No data to  display   ____________________________________________   INITIAL IMPRESSION / ASSESSMENT AND PLAN / ED COURSE  Pertinent labs & imaging results that were available during my care of the patient were reviewed by me and considered in my medical decision making (see chart for details).      Assessment and plan Viral URI 40 year old female presents to the emergency department with viral URI-like symptoms.  COVID-19 and influenza testing results were negative.  Recommended Tylenol and ibuprofen alternating for fever and body aches.  Rest and hydration were encouraged at home.  Tylenol and ibuprofen alternating were recommended for fever if fever occurs.  Return precautions were given to return with new or worsening symptoms.     ____________________________________________  FINAL CLINICAL IMPRESSION(S) / ED DIAGNOSES  Final diagnoses:  Viral URI      NEW MEDICATIONS STARTED DURING THIS VISIT:  ED Discharge Orders     None           This chart was dictated using voice recognition  software/Dragon. Despite best efforts to proofread, errors can occur which can change the meaning. Any change was purely unintentional.     Lannie Fields, PA-C 07/02/21 1755    Lavonia Drafts, MD 07/02/21 [phone]

## 2021-07-04 ENCOUNTER — Emergency Department
Admission: EM | Admit: 2021-07-04 | Discharge: 2021-07-04 | Disposition: A | Discharge status: Home / Self Care | Payer: Medicaid Other

## 2021-09-25 IMAGING — CR DG CHEST 2V
2 series · 2 of 2 positions shown · non-contrast
Comparison: 12/05/2020

CLINICAL DATA: COVID/short of breath. Shortness of breath.
Productive cough. Lightheaded and dizzy for 4 days. Current smoker.

EXAM:
CHEST - 2 VIEW

[chest pa]
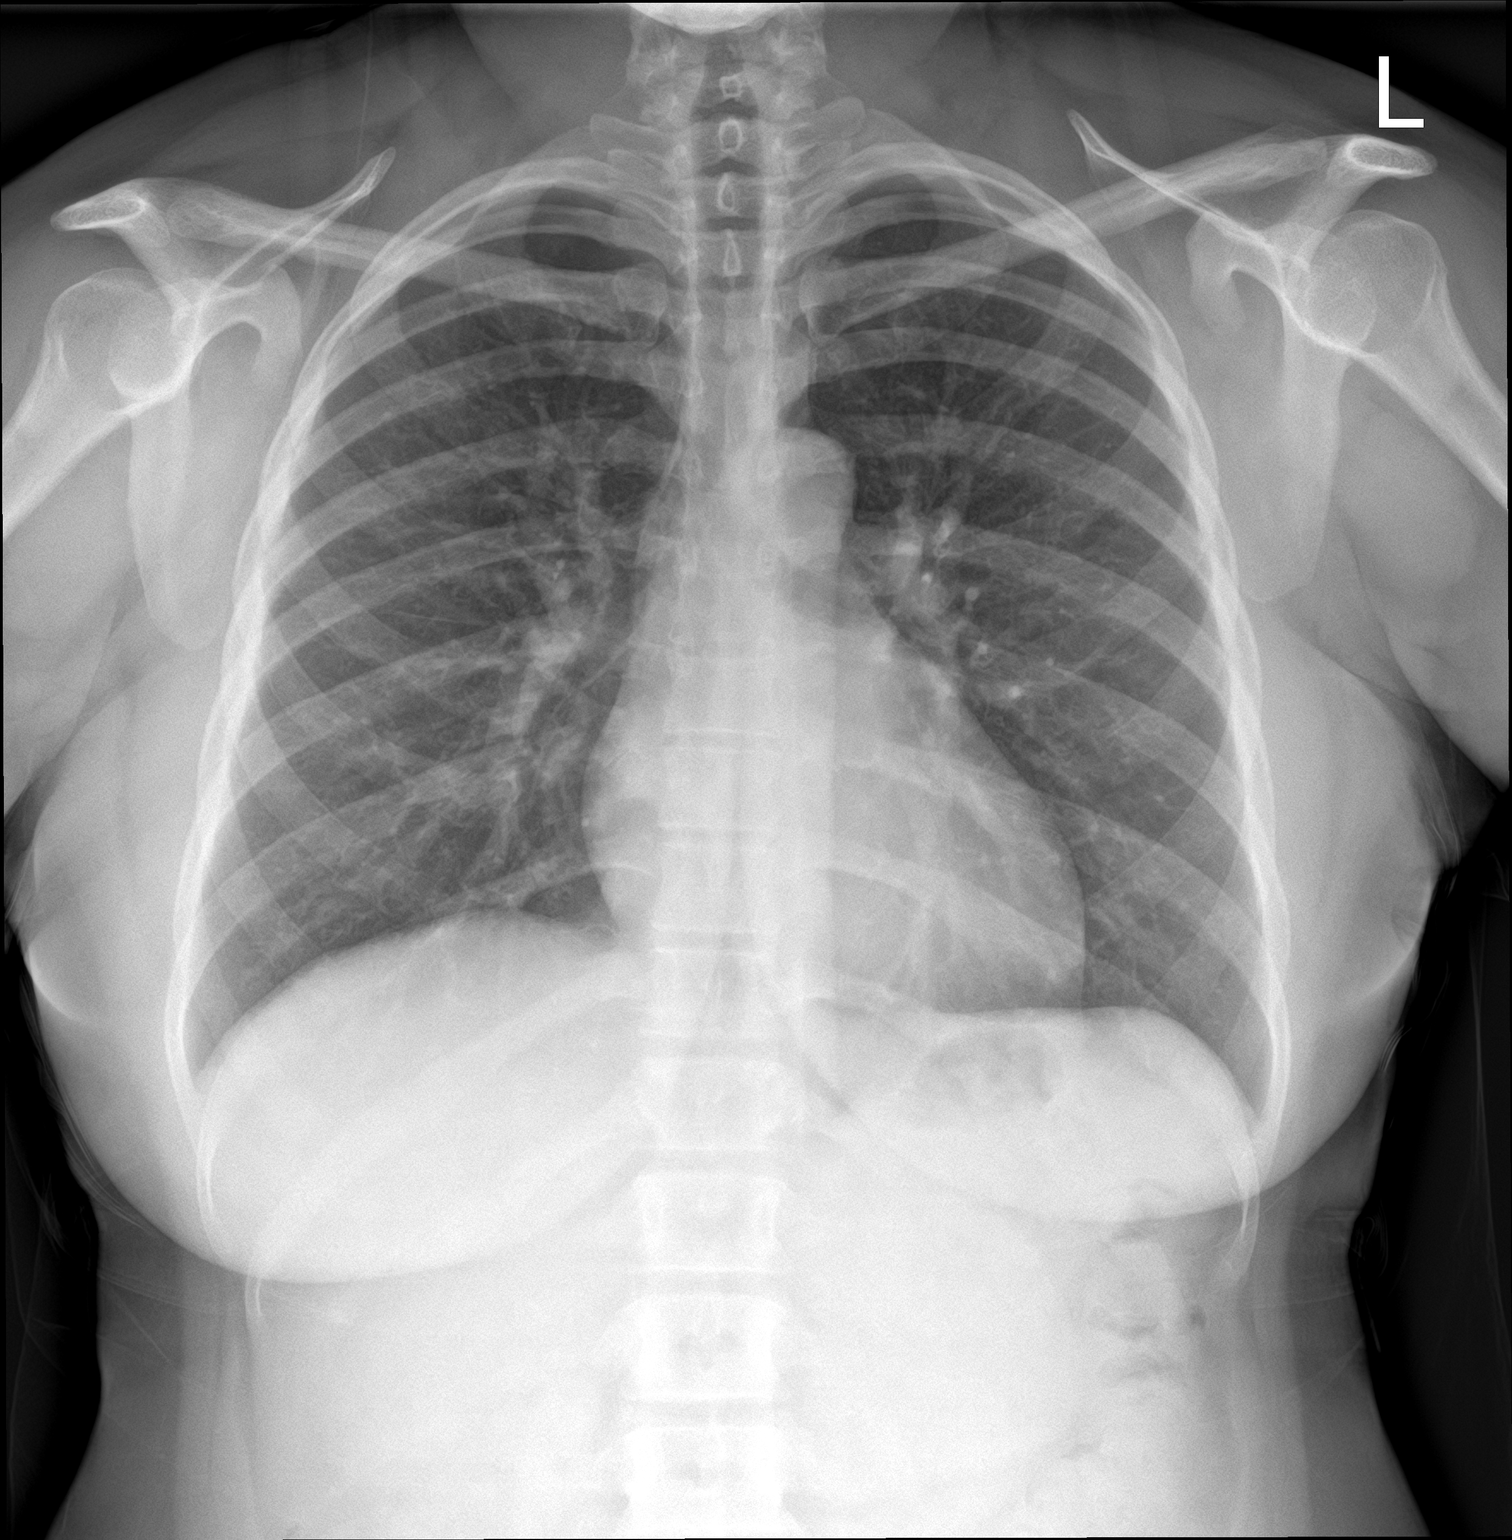

[chest lat]
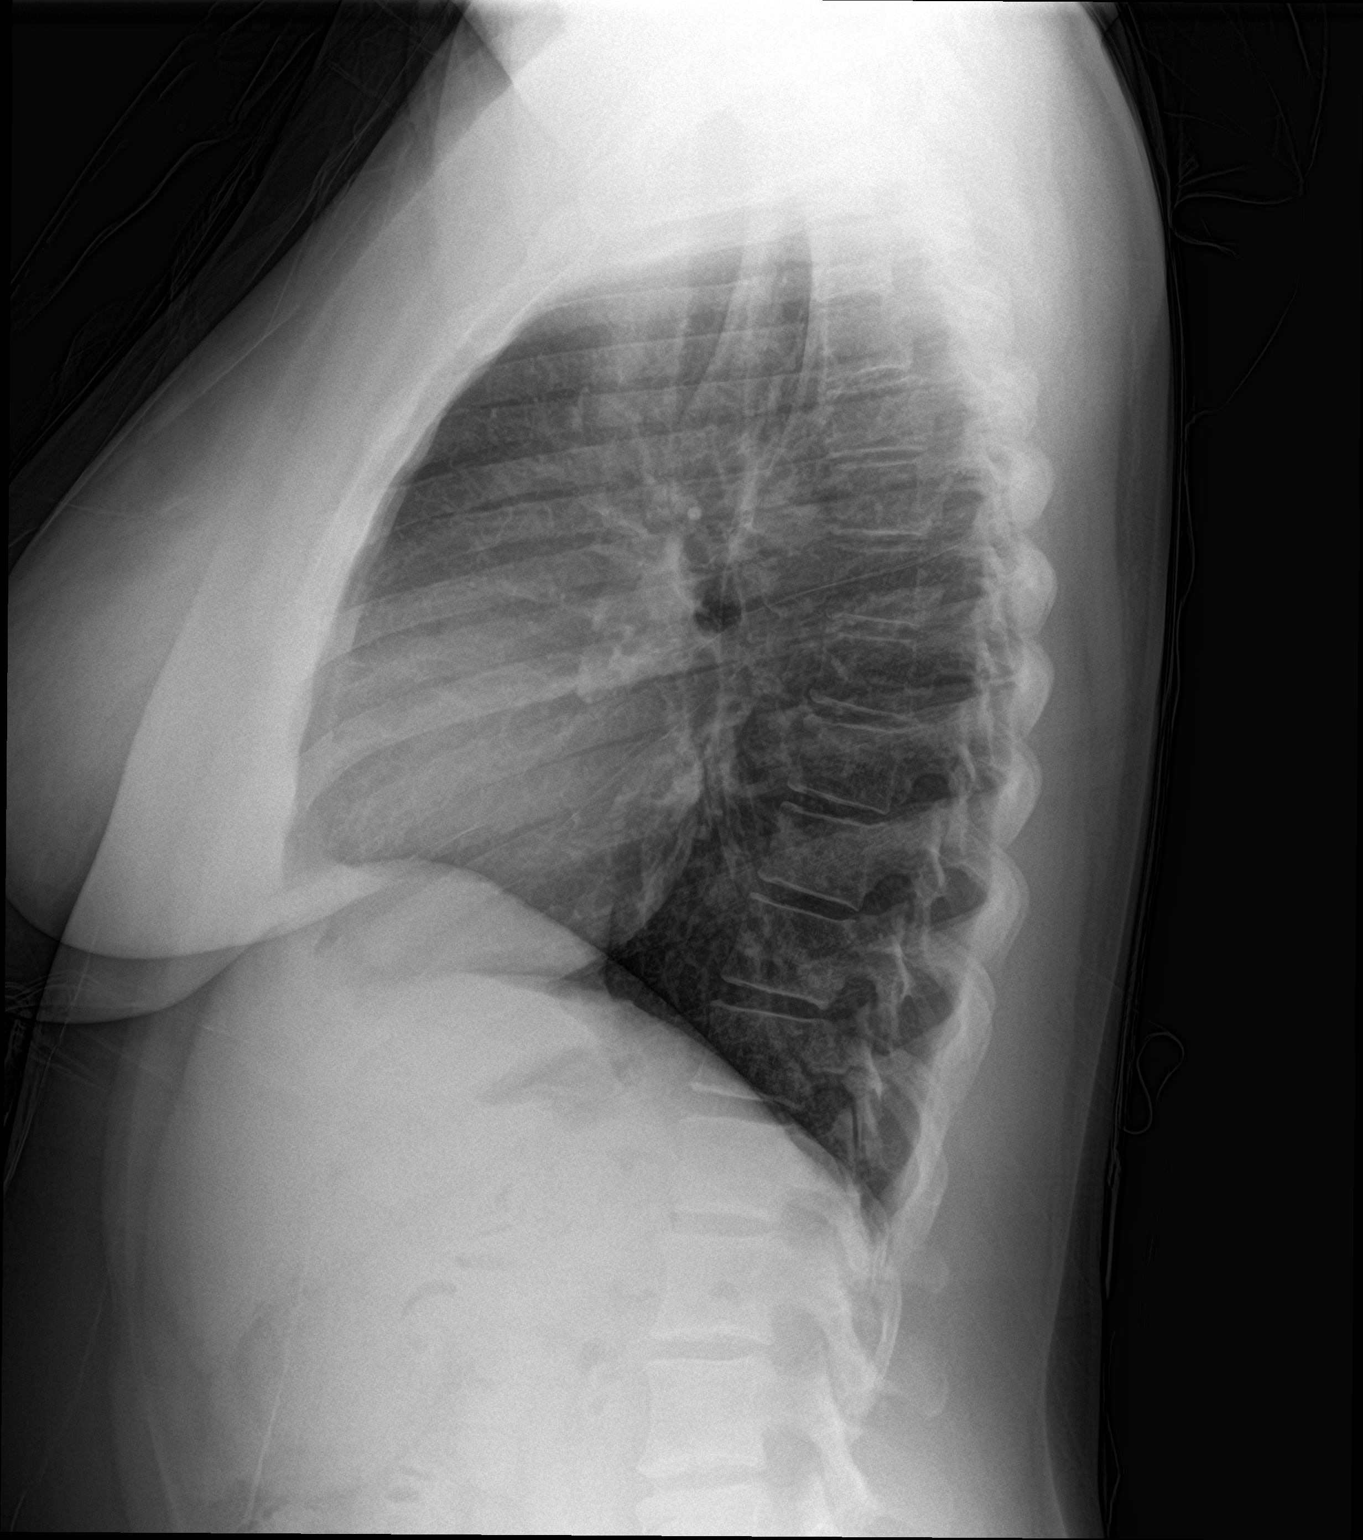

[2 of 2 positions shown; findings below may reference images not displayed]

FINDINGS: The heart size and mediastinal contours are within normal limits.
Both lungs are clear. The visualized skeletal structures are
unremarkable.
IMPRESSION: No active cardiopulmonary disease.
